# Patient Record
Sex: Male | Born: 1965 | Race: White | Hispanic: No | Marital: Married | Smoking: Never smoker
Health system: Southern US, Community
[De-identification: ages and names within clinical notes are randomized; demographics above are authoritative.]

## PROBLEM LIST (undated history)

## (undated) DIAGNOSIS — N200 Calculus of kidney: Secondary | ICD-10-CM

## (undated) DIAGNOSIS — K219 Gastro-esophageal reflux disease without esophagitis: Secondary | ICD-10-CM

## (undated) DIAGNOSIS — R51 Headache: Secondary | ICD-10-CM

## (undated) DIAGNOSIS — R21 Rash and other nonspecific skin eruption: Secondary | ICD-10-CM

---

## 1998-07-16 ENCOUNTER — Emergency Department (HOSPITAL_COMMUNITY): Admission: EM | Admit: 1998-07-16 | Discharge: 1998-07-16 | Payer: Self-pay | Admitting: Emergency Medicine

## 2012-01-15 ENCOUNTER — Encounter (HOSPITAL_COMMUNITY): Payer: Self-pay | Admitting: Pharmacy Technician

## 2012-01-16 ENCOUNTER — Encounter (HOSPITAL_COMMUNITY)
Admission: RE | Admit: 2012-01-16 | Discharge: 2012-01-16 | Disposition: A | Discharge status: Home / Self Care | Payer: Commercial Managed Care - PPO | Source: Ambulatory Visit | Attending: Orthopedic Surgery | Admitting: Orthopedic Surgery

## 2012-01-16 ENCOUNTER — Other Ambulatory Visit: Payer: Self-pay | Admitting: Orthopedic Surgery

## 2012-01-16 ENCOUNTER — Encounter (HOSPITAL_COMMUNITY): Payer: Self-pay

## 2012-01-16 HISTORY — DX: Gastro-esophageal reflux disease without esophagitis: K21.9

## 2012-01-16 HISTORY — DX: Headache: R51

## 2012-01-16 LAB — SURGICAL PCR SCREEN
MRSA, PCR: NEGATIVE
Staphylococcus aureus: POSITIVE — AB

## 2012-01-16 LAB — BASIC METABOLIC PANEL
CO2: 26 mEq/L (ref 19–32)
Calcium: 10.3 mg/dL (ref 8.4–10.5)
Creatinine, Ser: 1.21 mg/dL (ref 0.50–1.35)
GFR calc non Af Amer: 72 mL/min — ABNORMAL LOW (ref >90)
Sodium: 135 mEq/L (ref 135–145)

## 2012-01-16 LAB — CBC
MCH: 29.4 pg (ref 26.0–34.0)
MCV: 82.9 fL (ref 78.0–100.0)
Platelets: 225 10*3/uL (ref 150–400)
RBC: 4.8 MIL/uL (ref 4.22–5.81)
RDW: 12.6 % (ref 11.5–15.5)
WBC: 8.3 10*3/uL (ref 4.0–10.5)

## 2012-01-16 NOTE — Pre-Procedure Instructions (Signed)
22 [name]  01/16/2012   Your procedure is scheduled on:  01/17/12  Report to [facility] at 1150 AM.  Call this number if you have problems the morning of surgery: [phone]   Remember:   Do not eat food:After Midnight.  May have clear liquids:until Midnight .  Marland Kitchen  Take these medicines the morning of surgery with A SIP OF WATER: hydrocodone   Do not wear jewelry, make-up or nail polish.  Do not wear lotions, powders, or perfumes. You may wear deodorant.  Do not shave 48 hours prior to surgery. Men may shave face and neck.  Do not bring valuables to the hospital.  Contacts, dentures or bridgework may not be worn into surgery.  Leave suitcase in the car. After surgery it may be brought to your room.  For patients admitted to the hospital, checkout time is 11:00 AM the day of discharge.   Patients discharged the day of surgery will not be allowed to drive home.  Name and phone number of your driver: family  Special Instructions: CHG Shower Use Special Wash: 1/2 bottle night before surgery and 1/2 bottle morning of surgery.   Please read over the following fact sheets that you were given: Pain Booklet, Coughing and Deep Breathing, MRSA Information and Surgical Site Infection Prevention

## 2012-01-17 ENCOUNTER — Ambulatory Visit (HOSPITAL_COMMUNITY)
Admission: RE | Admit: 2012-01-17 | Discharge: 2012-01-17 | Disposition: A | Discharge status: Home / Self Care | Payer: Commercial Managed Care - PPO | Source: Ambulatory Visit | Attending: Orthopedic Surgery | Admitting: Orthopedic Surgery

## 2012-01-17 ENCOUNTER — Encounter (HOSPITAL_COMMUNITY): Payer: Self-pay | Admitting: Anesthesiology

## 2012-01-17 ENCOUNTER — Ambulatory Visit (HOSPITAL_COMMUNITY): Payer: Commercial Managed Care - PPO

## 2012-01-17 ENCOUNTER — Ambulatory Visit (HOSPITAL_COMMUNITY): Payer: Commercial Managed Care - PPO | Admitting: Anesthesiology

## 2012-01-17 ENCOUNTER — Encounter (HOSPITAL_COMMUNITY)
Admission: RE | Disposition: A | Discharge status: Home / Self Care | Payer: Self-pay | Source: Ambulatory Visit | Attending: Orthopedic Surgery

## 2012-01-17 ENCOUNTER — Encounter (HOSPITAL_COMMUNITY): Payer: Self-pay | Admitting: *Deleted

## 2012-01-17 DIAGNOSIS — M5126 Other intervertebral disc displacement, lumbar region: Secondary | ICD-10-CM | POA: Insufficient documentation

## 2012-01-17 DIAGNOSIS — M5410 Radiculopathy, site unspecified: Secondary | ICD-10-CM

## 2012-01-17 DIAGNOSIS — Z01812 Encounter for preprocedural laboratory examination: Secondary | ICD-10-CM | POA: Insufficient documentation

## 2012-01-17 DIAGNOSIS — K219 Gastro-esophageal reflux disease without esophagitis: Secondary | ICD-10-CM | POA: Insufficient documentation

## 2012-01-17 HISTORY — PX: LUMBAR LAMINECTOMY/DECOMPRESSION MICRODISCECTOMY: SHX5026

## 2012-01-17 HISTORY — DX: Rash and other nonspecific skin eruption: R21

## 2012-01-17 LAB — HEPATIC FUNCTION PANEL
ALT: 43 U/L (ref 0–53)
Albumin: 4.4 g/dL (ref 3.5–5.2)
Alkaline Phosphatase: 55 U/L (ref 39–117)
Indirect Bilirubin: 1.2 mg/dL — ABNORMAL HIGH (ref 0.3–0.9)
Total Protein: 7.7 g/dL (ref 6.0–8.3)

## 2012-01-17 LAB — CBC WITH DIFFERENTIAL/PLATELET
Eosinophils Relative: 1 % (ref 0–5)
HCT: 40.6 % (ref 39.0–52.0)
Hemoglobin: 14 g/dL (ref 13.0–17.0)
Lymphocytes Relative: 27 % (ref 12–46)
Lymphs Abs: 2.2 10*3/uL (ref 0.7–4.0)
MCV: 83 fL (ref 78.0–100.0)
Monocytes Absolute: 0.4 10*3/uL (ref 0.1–1.0)
Monocytes Relative: 6 % (ref 3–12)
Neutro Abs: 5.2 10*3/uL (ref 1.7–7.7)
WBC: 7.9 10*3/uL (ref 4.0–10.5)

## 2012-01-17 LAB — TYPE AND SCREEN: ABO/RH(D): A POS

## 2012-01-17 LAB — URINALYSIS, ROUTINE W REFLEX MICROSCOPIC
Glucose, UA: NEGATIVE mg/dL
Hgb urine dipstick: NEGATIVE
Leukocytes, UA: NEGATIVE
Protein, ur: NEGATIVE mg/dL
Specific Gravity, Urine: 1.022 (ref 1.005–1.030)
Urobilinogen, UA: 0.2 mg/dL (ref 0.0–1.0)

## 2012-01-17 LAB — APTT: aPTT: 30 seconds (ref 24–37)

## 2012-01-17 SURGERY — LUMBAR LAMINECTOMY/DECOMPRESSION MICRODISCECTOMY
Anesthesia: General | Site: Back | Laterality: Right | Wound class: Clean

## 2012-01-17 MED ORDER — METHYLPREDNISOLONE ACETATE 80 MG/ML IJ SUSP
INTRAMUSCULAR | Status: DC | PRN
  Administered 2012-01-17: 40 mg

## 2012-01-17 MED ORDER — THROMBIN 20000 UNITS EX KIT
PACK | CUTANEOUS | Status: DC | PRN
  Administered 2012-01-17: 12:00:00 via TOPICAL

## 2012-01-17 MED ORDER — NEOSTIGMINE METHYLSULFATE 1 MG/ML IJ SOLN
INTRAMUSCULAR | Status: DC | PRN
  Administered 2012-01-17: 5 mg via INTRAVENOUS

## 2012-01-17 MED ORDER — HEMOSTATIC AGENTS (NO CHARGE) OPTIME
TOPICAL | Status: DC | PRN
  Administered 2012-01-17: 1 via TOPICAL

## 2012-01-17 MED ORDER — HYDROMORPHONE HCL PF 1 MG/ML IJ SOLN
0.2500 mg | INTRAMUSCULAR | Status: DC | PRN
  Administered 2012-01-17 (×3): 0.5 mg via INTRAVENOUS

## 2012-01-17 MED ORDER — METOCLOPRAMIDE HCL 5 MG/ML IJ SOLN: 10.0000 mg | Freq: Once | INTRAMUSCULAR | Status: DC | PRN

## 2012-01-17 MED ORDER — INDIGOTINDISULFONATE SODIUM 8 MG/ML IJ SOLN
INTRAMUSCULAR | Status: DC | PRN
  Administered 2012-01-17: 1 mL

## 2012-01-17 MED ORDER — BUPIVACAINE-EPINEPHRINE 0.25% -1:200000 IJ SOLN
INTRAMUSCULAR | Status: DC | PRN
  Administered 2012-01-17: 5 mL

## 2012-01-17 MED ORDER — GLYCOPYRROLATE 0.2 MG/ML IJ SOLN
INTRAMUSCULAR | Status: DC | PRN
  Administered 2012-01-17: .8 mg via INTRAVENOUS

## 2012-01-17 MED ORDER — FENTANYL CITRATE 0.05 MG/ML IJ SOLN
INTRAMUSCULAR | Status: DC | PRN
  Administered 2012-01-17: 50 ug via INTRAVENOUS
  Administered 2012-01-17: 100 ug via INTRAVENOUS

## 2012-01-17 MED ORDER — DEXAMETHASONE SODIUM PHOSPHATE 10 MG/ML IJ SOLN
INTRAMUSCULAR | Status: DC | PRN
  Administered 2012-01-17: 10 mg via INTRAVENOUS

## 2012-01-17 MED ORDER — ACETAMINOPHEN 10 MG/ML IV SOLN
INTRAVENOUS | Status: AC
  Filled 2012-01-17: qty 100

## 2012-01-17 MED ORDER — LACTATED RINGERS IV SOLN
INTRAVENOUS | Status: DC
  Administered 2012-01-17: 11:00:00 via INTRAVENOUS

## 2012-01-17 MED ORDER — HYDROMORPHONE HCL PF 1 MG/ML IJ SOLN
INTRAMUSCULAR | Status: AC
  Administered 2012-01-17: 0.5 mg via INTRAVENOUS
  Filled 2012-01-17: qty 1

## 2012-01-17 MED ORDER — POVIDONE-IODINE 7.5 % EX SOLN
Freq: Once | CUTANEOUS | Status: DC
  Filled 2012-01-17: qty 118

## 2012-01-17 MED ORDER — DROPERIDOL 2.5 MG/ML IJ SOLN
INTRAMUSCULAR | Status: DC | PRN
  Administered 2012-01-17: .625 mg via INTRAVENOUS

## 2012-01-17 MED ORDER — ONDANSETRON HCL 4 MG/2ML IJ SOLN
INTRAMUSCULAR | Status: DC | PRN
  Administered 2012-01-17: 4 mg via INTRAVENOUS

## 2012-01-17 MED ORDER — PROPOFOL 10 MG/ML IV EMUL
INTRAVENOUS | Status: DC | PRN
  Administered 2012-01-17: 200 mg via INTRAVENOUS

## 2012-01-17 MED ORDER — OXYCODONE HCL 5 MG PO TABS: 5.0000 mg | ORAL_TABLET | Freq: Once | ORAL | Status: DC | PRN

## 2012-01-17 MED ORDER — ROCURONIUM BROMIDE 100 MG/10ML IV SOLN
INTRAVENOUS | Status: DC | PRN
  Administered 2012-01-17: 15 mg via INTRAVENOUS
  Administered 2012-01-17: 50 mg via INTRAVENOUS

## 2012-01-17 MED ORDER — HYDROMORPHONE HCL PF 1 MG/ML IJ SOLN
INTRAMUSCULAR | Status: AC
  Filled 2012-01-17: qty 1

## 2012-01-17 MED ORDER — LIDOCAINE HCL (CARDIAC) 20 MG/ML IV SOLN
INTRAVENOUS | Status: DC | PRN
  Administered 2012-01-17: 100 mg via INTRAVENOUS

## 2012-01-17 MED ORDER — ACETAMINOPHEN 10 MG/ML IV SOLN
INTRAVENOUS | Status: DC | PRN
  Administered 2012-01-17: 1000 mg via INTRAVENOUS

## 2012-01-17 MED ORDER — MIDAZOLAM HCL 5 MG/5ML IJ SOLN
INTRAMUSCULAR | Status: DC | PRN
  Administered 2012-01-17: 2 mg via INTRAVENOUS

## 2012-01-17 MED ORDER — CLINDAMYCIN PHOSPHATE 900 MG/50ML IV SOLN
900.0000 mg | INTRAVENOUS | Status: AC
  Administered 2012-01-17: 900 mg via INTRAVENOUS
  Filled 2012-01-17: qty 50

## 2012-01-17 MED ORDER — LACTATED RINGERS IV SOLN
INTRAVENOUS | Status: DC | PRN
  Administered 2012-01-17 (×2): via INTRAVENOUS

## 2012-01-17 SURGICAL SUPPLY — 62 items
APL SKNCLS STERI-STRIP NONHPOA (GAUZE/BANDAGES/DRESSINGS)
BENZOIN TINCTURE PRP APPL 2/3 (GAUZE/BANDAGES/DRESSINGS) IMPLANT
BUR ROUND PRECISION 4.0 (BURR) ×2 IMPLANT
CANISTER SUCTION 2500CC (MISCELLANEOUS) ×2 IMPLANT
CLOTH BEACON ORANGE TIMEOUT ST (SAFETY) ×2 IMPLANT
CLSR STERI-STRIP ANTIMIC 1/2X4 (GAUZE/BANDAGES/DRESSINGS) ×1 IMPLANT
CONT SPEC STER OR (MISCELLANEOUS) ×2 IMPLANT
CORDS BIPOLAR (ELECTRODE) ×2 IMPLANT
COVER SURGICAL LIGHT HANDLE (MISCELLANEOUS) ×2 IMPLANT
DRAIN CHANNEL 10F 3/8 F FF (DRAIN) IMPLANT
DRAPE POUCH INSTRU U-SHP 10X18 (DRAPES) ×4 IMPLANT
DRAPE SURG 17X23 STRL (DRAPES) ×8 IMPLANT
DURAPREP 26ML APPLICATOR (WOUND CARE) ×2 IMPLANT
ELECT BLADE 4.0 EZ CLEAN MEGAD (MISCELLANEOUS)
ELECT BLADE 6.5 EXT (BLADE) IMPLANT
ELECT CAUTERY BLADE 6.4 (BLADE) ×2 IMPLANT
ELECT REM PT RETURN 9FT ADLT (ELECTROSURGICAL) ×2
ELECTRODE BLDE 4.0 EZ CLN MEGD (MISCELLANEOUS) IMPLANT
ELECTRODE REM PT RTRN 9FT ADLT (ELECTROSURGICAL) ×1 IMPLANT
EVACUATOR SILICONE 100CC (DRAIN) IMPLANT
FILTER STRAW FLUID ASPIR (MISCELLANEOUS) ×2 IMPLANT
GAUZE SPONGE 4X4 16PLY XRAY LF (GAUZE/BANDAGES/DRESSINGS) ×4 IMPLANT
GLOVE BIO SURGEON STRL SZ8 (GLOVE) ×2 IMPLANT
GLOVE BIOGEL PI IND STRL 8 (GLOVE) ×1 IMPLANT
GLOVE BIOGEL PI INDICATOR 8 (GLOVE) ×1
GOWN STRL NON-REIN LRG LVL3 (GOWN DISPOSABLE) ×4 IMPLANT
IV CATH 14GX2 1/4 (CATHETERS) ×2 IMPLANT
KIT BASIN OR (CUSTOM PROCEDURE TRAY) ×2 IMPLANT
KIT ROOM TURNOVER OR (KITS) ×2 IMPLANT
NDL 18GX1X1/2 (RX/OR ONLY) (NEEDLE) ×1 IMPLANT
NDL HYPO 25GX1X1/2 BEV (NEEDLE) ×1 IMPLANT
NDL SPNL 18GX3.5 QUINCKE PK (NEEDLE) ×2 IMPLANT
NEEDLE 18GX1X1/2 (RX/OR ONLY) (NEEDLE) ×2 IMPLANT
NEEDLE HYPO 25GX1X1/2 BEV (NEEDLE) ×2 IMPLANT
NEEDLE SPNL 18GX3.5 QUINCKE PK (NEEDLE) ×4 IMPLANT
NS IRRIG 1000ML POUR BTL (IV SOLUTION) ×2 IMPLANT
PACK LAMINECTOMY ORTHO (CUSTOM PROCEDURE TRAY) ×2 IMPLANT
PACK UNIVERSAL I (CUSTOM PROCEDURE TRAY) ×2 IMPLANT
PAD ARMBOARD 7.5X6 YLW CONV (MISCELLANEOUS) ×4 IMPLANT
PATTIES SURGICAL .5 X.5 (GAUZE/BANDAGES/DRESSINGS) IMPLANT
PATTIES SURGICAL .5 X1 (DISPOSABLE) ×2 IMPLANT
PATTIES SURGICAL 1X1 (DISPOSABLE) IMPLANT
SPONGE GAUZE 4X4 12PLY (GAUZE/BANDAGES/DRESSINGS) ×2 IMPLANT
STRIP CLOSURE SKIN 1/2X4 (GAUZE/BANDAGES/DRESSINGS) IMPLANT
SURGIFLO TRUKIT (HEMOSTASIS) IMPLANT
SURGIFLO W/THROMBIN 8M KIT (HEMOSTASIS) ×1 IMPLANT
SUT ETHILON 3 0 FSL (SUTURE) IMPLANT
SUT VIC AB 0 CT1 27 (SUTURE)
SUT VIC AB 0 CT1 27XBRD ANBCTR (SUTURE) IMPLANT
SUT VIC AB 0 CT2 27 (SUTURE) ×2 IMPLANT
SUT VIC AB 1 CT1 18XCR BRD 8 (SUTURE) ×1 IMPLANT
SUT VIC AB 1 CT1 8-18 (SUTURE) ×2
SUT VIC AB 2-0 CT2 18 VCP726D (SUTURE) ×2 IMPLANT
SYR 20CC LL (SYRINGE) IMPLANT
SYR BULB IRRIGATION 50ML (SYRINGE) ×2 IMPLANT
SYR CONTROL 10ML LL (SYRINGE) ×2 IMPLANT
SYR TB 1ML 26GX3/8 SAFETY (SYRINGE) ×4 IMPLANT
SYR TB 1ML LUER SLIP (SYRINGE) ×4 IMPLANT
TOWEL OR 17X24 6PK STRL BLUE (TOWEL DISPOSABLE) ×2 IMPLANT
TOWEL OR 17X26 10 PK STRL BLUE (TOWEL DISPOSABLE) ×2 IMPLANT
WATER STERILE IRR 1000ML POUR (IV SOLUTION) ×2 IMPLANT
YANKAUER SUCT BULB TIP NO VENT (SUCTIONS) ×2 IMPLANT

## 2012-01-17 NOTE — Transfer of Care (Signed)
Immediate Anesthesia Transfer of Care Note  Patient: [name]  Procedure(s) Performed: Procedure(s) (LRB): LUMBAR LAMINECTOMY/DECOMPRESSION MICRODISCECTOMY (Right)  Patient Location: PACU  Anesthesia Type: General  Level of Consciousness: awake, alert  and oriented  Airway & Oxygen Therapy: Patient Spontanous Breathing  Post-op Assessment: Report given to PACU RN and Post -op Vital signs reviewed and stable  Post vital signs: Reviewed and stable  Complications: No apparent anesthesia complications

## 2012-01-17 NOTE — Anesthesia Preprocedure Evaluation (Addendum)
Anesthesia Evaluation  Patient identified by MRN, date of birth, ID band Patient awake    Reviewed: Allergy & Precautions, H&P , NPO status , Patient's Chart, lab work & pertinent test results, reviewed documented beta blocker date and time   History of Anesthesia Complications Negative for: history of anesthetic complications  Airway Mallampati: II TM Distance: >3 FB Neck ROM: full    Dental  (+) Dental Advisory Given and Teeth Intact   Pulmonary neg pulmonary ROS,          Cardiovascular negative cardio ROS      Neuro/Psych  Headaches, negative psych ROS   GI/Hepatic Neg liver ROS, GERD-  Medicated and Controlled,  Endo/Other  negative endocrine ROS  Renal/GU negative Renal ROS  negative genitourinary   Musculoskeletal   Abdominal   Peds  Hematology negative hematology ROS (+)   Anesthesia Other Findings See surgeon's H&P   Reproductive/Obstetrics negative OB ROS                         Anesthesia Physical Anesthesia Plan  ASA: II  Anesthesia Plan: General   Post-op Pain Management:    Induction: Intravenous  Airway Management Planned: Oral ETT  Additional Equipment:   Intra-op Plan:   Post-operative Plan: Extubation in OR  Informed Consent: I have reviewed the patients History and Physical, chart, labs and discussed the procedure including the risks, benefits and alternatives for the proposed anesthesia with the patient or authorized representative who has indicated his/her understanding and acceptance.   Dental Advisory Given  Plan Discussed with: CRNA and Surgeon  Anesthesia Plan Comments:         Anesthesia Quick Evaluation

## 2012-01-17 NOTE — Preoperative (Signed)
Beta Blockers   Reason not to administer Beta Blockers:Not Applicable 

## 2012-01-17 NOTE — H&P (Signed)
PREOPERATIVE H&P  Chief Complaint: Right leg pain  HPI: [name] is a 46 y.o. male who presents with right leg pain x 6 months  Past Medical History  Diagnosis Date  . Headache   . GERD (gastroesophageal reflux disease)   . Rash    Past Surgical History  Procedure Date  . No past surgeries    History   Social History  . Marital Status: Married    Spouse Name: N/A    Number of Children: N/A  . Years of Education: N/A   Social History Main Topics  . Smoking status: Never Smoker   . Smokeless tobacco: Not on file  . Alcohol Use: Yes     weekly  . Drug Use: No  . Sexually Active:    Other Topics Concern  . Not on file   Social History Narrative  . No narrative on file   No family history on file. No Known Allergies Prior to Admission medications   Medication Sig Start Date End Date Taking? Authorizing Provider  acetaminophen (TYLENOL) 500 MG tablet Take 500 mg by mouth every 6 (six) hours as needed. For pain   Yes Historical Provider, MD  clotrimazole (LOTRIMIN) 1 % cream Apply 1 application topically 2 (two) times daily.   Yes Historical Provider, MD  HYDROcodone-acetaminophen (NORCO) 5-325 MG per tablet Take 1-2 tablets by mouth every 8 (eight) hours as needed. For pain   Yes Historical Provider, MD     All other systems have been reviewed and were otherwise negative with the exception of those mentioned in the HPI and as above.  Physical Exam: Filed Vitals:   01/17/12 0939  BP: 145/93  Pulse: 80  Temp: 98.3 F (36.8 C)  Resp: 20    General: Alert, no acute distress Cardiovascular: No pedal edema Respiratory: No cyanosis, no use of accessory musculature GI: No organomegaly, abdomen is soft and non-tender Skin: No lesions in the area of chief complaint Neurologic: Sensation intact distally Psychiatric: Patient is competent for consent with normal mood and affect Lymphatic: No axillary or cervical lymphadenopathy  MUSCULOSKELETAL: + SLR on  right  Assessment/Plan: rigth S1 radiculopathy Plan for Procedure(s): Right L5/S1 microdiscectomy   Emilee Hero, MD 01/17/2012 10:59 AM

## 2012-01-17 NOTE — Anesthesia Postprocedure Evaluation (Signed)
Anesthesia Post Note  Patient: [name]  Procedure(s) Performed: Procedure(s) (LRB): LUMBAR LAMINECTOMY/DECOMPRESSION MICRODISCECTOMY (Right)  Anesthesia type: general  Patient location: PACU  Post pain: Pain level controlled  Post assessment: Patient's Cardiovascular Status Stable  Last Vitals:  Filed Vitals:   01/17/12 1345  BP:   Pulse: 70  Temp:   Resp: 15    Post vital signs: Reviewed and stable  Level of consciousness: sedated  Complications: No apparent anesthesia complications

## 2012-01-18 NOTE — Op Note (Signed)
[name], [name] NO.:  [id number]  MEDICAL RECORD NO.:  [id number]  LOCATION:  [facility]                         FACILITY:  [facility]  PHYSICIAN:  Estill Bamberg, MD      DATE OF BIRTH:  1966-01-11  DATE OF PROCEDURE:  01/17/2012 DATE OF DISCHARGE:  01/17/2012                              OPERATIVE REPORT   PREOPERATIVE DIAGNOSES: 1. Right-sided S1 radiculopathy. 2. Right-sided L5-S1 disk herniation.  POSTOPERATIVE DIAGNOSES: 1. Right-sided S1 radiculopathy. 2. Right-sided L5-S1 disk herniation.  SURGEON:  Estill Bamberg, MD  ASSISTANT:  None.  ANESTHESIA:  General endotracheal anesthesia.  COMPLICATIONS:  None.  DISPOSITION:  Stable.  ESTIMATED BLOOD LOSS:  Minimal.  INDICATIONS FOR PROCEDURE:  Briefly, [name] is a very pleasant 46 year old male who was referred to me on January 12, 2012, with a 10-month history of severe pain in the right leg.  The patient was evaluated by me and clearly noted to have severe right leg pain in the distribution of the S1 nerve.  He did fail conservative care in the form of physical therapy, medications, and two epidural injections.  I did review an MRI, which was clearly notable for a large L5-S1 disk herniation, clearly causing compression of the lateral recesses involving both the right and the left sides.  However, the patient's symptoms were clearly associated with right-sided S1 radiculopathy and he did not have any symptoms related to the left side at the time of my evaluation with the patient in the office.  Given the patient's debilitating pain and his failure of nonoperative measures, we did have a discussion regarding going forward with the right-sided L5-S1 microdiskectomy procedure.  The patient fully understood the risks and limitations of the procedure as outlined in my preoperative note.  OPERATIVE DETAILS:  On January 17, 2012, the patient was brought to Surgery and general endotracheal anesthesia was  administered.  The patient was placed prone on a well-padded flat Jackson bed with a Wilson frame.  All bony prominences were meticulously padded.  Antibiotics were given. SCDs were placed and a time-out procedure was performed.  I then placed two 18-gauge spinal needles over the midline of the lumbar spine.  A lateral intraoperative radiograph did confirm the position of the needles and did help me optimize my midline incision.  I then made an incision over the midline, centered over the L5-S1 interspace.  The fascia was identified and incised in a curvilinear fashion towards the right side.  The paraspinal musculature was bluntly swept laterally and the lamina of L5 and S1 were readily noted.  A self-retaining McCullough retractor was placed.  I then obtained a second intraoperative lateral radiograph to confirm the appropriate operative level.  I then used a high-speed bur to remove the inferior medial aspect of the L5 lamina. Ligamentum flavum was identified and taken down using a series of curettes and Kerrison punches.  The dural sac and the traversing S1 nerve roots were readily identified and noted to be under significant pressure.  The nerve was gently swept medially.  I had an assistant to hold the medial retraction of the traversing S1 nerve.  It was readily  noted that the traversing S1 nerve was under significant tension.  I did use a Penfield 4 to place downward pressure over the intervertebral space and a large disk fragment did clear itself through an annular rent.  This was removed.  I then used a Public house manager to continue to explore the epidural space anterior to the S1 nerve, and it was readily noted that there was ongoing pressure.  It was readily noted that there continued to be a prominence at the medial aspect of the nerve.  I did continue to provide medial mobilization of the nerve and I did use reverse angled Epstein curette to displace the prominent disk  fragments into the intervertebral space.  These were removed using a forward angled pituitary.  At the termination of this portion of the procedure, I was easily able to mobilize each traversing S1 nerve both medially and laterally with no undue pressure noted.  I then used SurgiFlo to control all epidural bleeding.  Once the SurgiFlo was removed, there was no undue bleeding encountered.  At this point, the wound was copiously irrigated.  I then infiltrated 40 mg of Depo-Medrol about the S1 nerve on the right side.  I then closed the fascia using #1 Vicryl.  The subcutaneous layer was then closed using 2-0 Vicryl and the skin was then closed using 3-0 Monocryl.  All instrument counts were correct at the termination of the procedure.     Estill Bamberg, MD     MD/MEDQ  D:  01/17/2012  T:  01/18/2012  Job:  [id number]

## 2012-01-19 ENCOUNTER — Encounter (HOSPITAL_COMMUNITY): Payer: Self-pay | Admitting: Orthopedic Surgery

## 2012-12-17 ENCOUNTER — Ambulatory Visit (HOSPITAL_COMMUNITY): Payer: Commercial Managed Care - PPO | Admitting: Certified Registered"

## 2012-12-17 ENCOUNTER — Ambulatory Visit (HOSPITAL_COMMUNITY)
Admission: EM | Admit: 2012-12-17 | Discharge: 2012-12-17 | Disposition: A | Discharge status: Home / Self Care | Payer: Commercial Managed Care - PPO | Attending: Urology | Admitting: Urology

## 2012-12-17 ENCOUNTER — Emergency Department (HOSPITAL_COMMUNITY): Payer: Commercial Managed Care - PPO

## 2012-12-17 ENCOUNTER — Encounter (HOSPITAL_COMMUNITY): Admission: EM | Disposition: A | Discharge status: Home / Self Care | Payer: Self-pay | Source: Home / Self Care

## 2012-12-17 ENCOUNTER — Encounter (HOSPITAL_COMMUNITY): Payer: Self-pay

## 2012-12-17 ENCOUNTER — Encounter (HOSPITAL_COMMUNITY): Payer: Self-pay | Admitting: Certified Registered"

## 2012-12-17 ENCOUNTER — Other Ambulatory Visit: Payer: Self-pay | Admitting: Urology

## 2012-12-17 DIAGNOSIS — K219 Gastro-esophageal reflux disease without esophagitis: Secondary | ICD-10-CM | POA: Insufficient documentation

## 2012-12-17 DIAGNOSIS — N1330 Unspecified hydronephrosis: Secondary | ICD-10-CM | POA: Insufficient documentation

## 2012-12-17 DIAGNOSIS — N201 Calculus of ureter: Secondary | ICD-10-CM | POA: Insufficient documentation

## 2012-12-17 HISTORY — PX: CYSTOSCOPY WITH RETROGRADE PYELOGRAM, URETEROSCOPY AND STENT PLACEMENT: SHX5789

## 2012-12-17 LAB — CBC WITH DIFFERENTIAL/PLATELET
Basophils Absolute: 0.1 10*3/uL (ref 0.0–0.1)
Lymphocytes Relative: 14 % (ref 12–46)
Lymphs Abs: 1.5 10*3/uL (ref 0.7–4.0)
Neutro Abs: 8.7 10*3/uL — ABNORMAL HIGH (ref 1.7–7.7)
Platelets: 222 10*3/uL (ref 150–400)
RBC: 4.97 MIL/uL (ref 4.22–5.81)
RDW: 12.8 % (ref 11.5–15.5)
WBC: 10.7 10*3/uL — ABNORMAL HIGH (ref 4.0–10.5)

## 2012-12-17 LAB — BASIC METABOLIC PANEL
CO2: 17 mEq/L — ABNORMAL LOW (ref 19–32)
Chloride: 100 mEq/L (ref 96–112)
Glucose, Bld: 144 mg/dL — ABNORMAL HIGH (ref 70–99)
Potassium: 3.2 mEq/L — ABNORMAL LOW (ref 3.5–5.1)
Sodium: 136 mEq/L (ref 135–145)

## 2012-12-17 LAB — URINALYSIS, ROUTINE W REFLEX MICROSCOPIC
Glucose, UA: 100 mg/dL — AB
Leukocytes, UA: NEGATIVE
Protein, ur: NEGATIVE mg/dL
Specific Gravity, Urine: 1.022 (ref 1.005–1.030)
pH: 7 (ref 5.0–8.0)

## 2012-12-17 LAB — SURGICAL PCR SCREEN
MRSA, PCR: NEGATIVE
Staphylococcus aureus: NEGATIVE

## 2012-12-17 SURGERY — CYSTOURETEROSCOPY, WITH RETROGRADE PYELOGRAM AND STENT INSERTION
Anesthesia: General | Site: Ureter | Laterality: Left | Wound class: Clean Contaminated

## 2012-12-17 MED ORDER — LIDOCAINE HCL (PF) 2 % IJ SOLN
INTRAMUSCULAR | Status: DC | PRN
  Administered 2012-12-17: 30 mg

## 2012-12-17 MED ORDER — KETOROLAC TROMETHAMINE 30 MG/ML IJ SOLN
INTRAMUSCULAR | Status: DC | PRN
  Administered 2012-12-17: 30 mg via INTRAVENOUS

## 2012-12-17 MED ORDER — FENTANYL CITRATE 0.05 MG/ML IJ SOLN
50.0000 ug | INTRAMUSCULAR | Status: DC | PRN
  Administered 2012-12-17: 50 ug via INTRAVENOUS
  Administered 2012-12-17: 100 ug via INTRAVENOUS
  Administered 2012-12-17: 50 ug via INTRAVENOUS

## 2012-12-17 MED ORDER — BELLADONNA ALKALOIDS-OPIUM 16.2-60 MG RE SUPP
RECTAL | Status: AC
  Filled 2012-12-17: qty 1

## 2012-12-17 MED ORDER — PROPOFOL 10 MG/ML IV BOLUS
INTRAVENOUS | Status: DC | PRN
  Administered 2012-12-17: 200 mg via INTRAVENOUS

## 2012-12-17 MED ORDER — ONDANSETRON HCL 4 MG/2ML IJ SOLN
4.0000 mg | Freq: Once | INTRAMUSCULAR | Status: AC
  Administered 2012-12-17: 4 mg via INTRAVENOUS

## 2012-12-17 MED ORDER — MUPIROCIN 2 % EX OINT
TOPICAL_OINTMENT | Freq: Two times a day (BID) | CUTANEOUS | Status: DC
  Administered 2012-12-17: 13:00:00 via NASAL
  Filled 2012-12-17: qty 22

## 2012-12-17 MED ORDER — TAMSULOSIN HCL 0.4 MG PO CAPS: 0.4000 mg | ORAL_CAPSULE | Freq: Every day | ORAL | Status: DC

## 2012-12-17 MED ORDER — DEXTROSE-NACL 5-0.45 % IV SOLN: INTRAVENOUS | Status: DC

## 2012-12-17 MED ORDER — LIDOCAINE HCL 2 % EX GEL
CUTANEOUS | Status: AC
  Filled 2012-12-17: qty 10

## 2012-12-17 MED ORDER — ONDANSETRON HCL 4 MG/2ML IJ SOLN
INTRAMUSCULAR | Status: AC
  Filled 2012-12-17: qty 2

## 2012-12-17 MED ORDER — SUCCINYLCHOLINE CHLORIDE 20 MG/ML IJ SOLN
INTRAMUSCULAR | Status: DC | PRN
  Administered 2012-12-17: 100 mg via INTRAVENOUS

## 2012-12-17 MED ORDER — HYDROMORPHONE HCL PF 1 MG/ML IJ SOLN
1.0000 mg | Freq: Once | INTRAMUSCULAR | Status: AC
  Administered 2012-12-17: 1 mg via INTRAVENOUS
  Filled 2012-12-17: qty 1

## 2012-12-17 MED ORDER — FENTANYL CITRATE 0.05 MG/ML IJ SOLN
INTRAMUSCULAR | Status: DC | PRN
Start: 2012-12-17 — End: 2012-12-17
  Administered 2012-12-17 (×2): 50 ug via INTRAVENOUS

## 2012-12-17 MED ORDER — HYDROCODONE-IBUPROFEN 5-200 MG PO TABS: 1.0000 | ORAL_TABLET | Freq: Four times a day (QID) | ORAL | Status: DC | PRN

## 2012-12-17 MED ORDER — FENTANYL CITRATE 0.05 MG/ML IJ SOLN
INTRAMUSCULAR | Status: AC
  Filled 2012-12-17: qty 2

## 2012-12-17 MED ORDER — MORPHINE SULFATE 4 MG/ML IJ SOLN
4.0000 mg | Freq: Once | INTRAMUSCULAR | Status: AC
  Administered 2012-12-17: 4 mg via INTRAVENOUS
  Filled 2012-12-17: qty 1

## 2012-12-17 MED ORDER — 0.9 % SODIUM CHLORIDE (POUR BTL) OPTIME
TOPICAL | Status: DC | PRN
  Administered 2012-12-17: 1000 mL

## 2012-12-17 MED ORDER — BELLADONNA ALKALOIDS-OPIUM 16.2-60 MG RE SUPP
RECTAL | Status: DC | PRN
  Administered 2012-12-17: 1 via RECTAL

## 2012-12-17 MED ORDER — IOHEXOL 300 MG/ML  SOLN
INTRAMUSCULAR | Status: AC
  Filled 2012-12-17: qty 1

## 2012-12-17 MED ORDER — PROMETHAZINE HCL 25 MG/ML IJ SOLN: 6.2500 mg | INTRAMUSCULAR | Status: DC | PRN

## 2012-12-17 MED ORDER — CEFAZOLIN SODIUM-DEXTROSE 2-3 GM-% IV SOLR
INTRAVENOUS | Status: AC
  Filled 2012-12-17: qty 50

## 2012-12-17 MED ORDER — PROMETHAZINE HCL 25 MG/ML IJ SOLN
25.0000 mg | Freq: Four times a day (QID) | INTRAMUSCULAR | Status: DC | PRN
  Administered 2012-12-17 (×2): 6.25 mg via INTRAVENOUS
  Filled 2012-12-17: qty 1

## 2012-12-17 MED ORDER — MIDAZOLAM HCL 5 MG/5ML IJ SOLN
INTRAMUSCULAR | Status: DC | PRN
  Administered 2012-12-17: 2 mg via INTRAVENOUS

## 2012-12-17 MED ORDER — SODIUM CHLORIDE 0.9 % IV SOLN: 1.0000 mg/h | INTRAVENOUS | Status: DC

## 2012-12-17 MED ORDER — SODIUM CHLORIDE 0.9 % IV BOLUS (SEPSIS)
1000.0000 mL | Freq: Once | INTRAVENOUS | Status: AC
  Administered 2012-12-17: 1000 mL via INTRAVENOUS

## 2012-12-17 MED ORDER — ONDANSETRON HCL 4 MG/2ML IJ SOLN
4.0000 mg | Freq: Once | INTRAMUSCULAR | Status: AC
  Administered 2012-12-17: 4 mg via INTRAVENOUS
  Filled 2012-12-17: qty 2

## 2012-12-17 MED ORDER — CEFAZOLIN SODIUM-DEXTROSE 2-3 GM-% IV SOLR
2.0000 g | INTRAVENOUS | Status: AC
  Administered 2012-12-17: 2 g via INTRAVENOUS

## 2012-12-17 MED ORDER — HYDROMORPHONE HCL PF 1 MG/ML IJ SOLN
0.5000 mg | INTRAMUSCULAR | Status: DC | PRN
  Administered 2012-12-17 (×4): 0.5 mg via INTRAVENOUS
  Filled 2012-12-17 (×2): qty 1

## 2012-12-17 MED ORDER — TRIMETHOPRIM 100 MG PO TABS: 100.0000 mg | ORAL_TABLET | ORAL | Status: DC

## 2012-12-17 MED ORDER — LACTATED RINGERS IV SOLN
INTRAVENOUS | Status: DC
  Administered 2012-12-17: 1000 mL via INTRAVENOUS

## 2012-12-17 MED ORDER — ONDANSETRON HCL 4 MG/2ML IJ SOLN
INTRAMUSCULAR | Status: DC | PRN
  Administered 2012-12-17: 4 mg via INTRAVENOUS

## 2012-12-17 MED ORDER — FENTANYL CITRATE 0.05 MG/ML IJ SOLN: 25.0000 ug | INTRAMUSCULAR | Status: DC | PRN

## 2012-12-17 MED ORDER — IOHEXOL 300 MG/ML  SOLN
INTRAMUSCULAR | Status: DC | PRN
  Administered 2012-12-17: 9 mL via URETHRAL

## 2012-12-17 MED ORDER — PHENAZOPYRIDINE HCL 200 MG PO TABS: 200.0000 mg | ORAL_TABLET | Freq: Three times a day (TID) | ORAL | Status: DC | PRN

## 2012-12-17 MED ORDER — ONDANSETRON HCL 4 MG/2ML IJ SOLN
4.0000 mg | INTRAMUSCULAR | Status: DC | PRN
  Administered 2012-12-17: 4 mg via INTRAVENOUS

## 2012-12-17 SURGICAL SUPPLY — 14 items
ADAPTER CATH URET PLST 4-6FR (CATHETERS) ×2 IMPLANT
ADPR CATH URET STRL DISP 4-6FR (CATHETERS) ×1
BAG URO CATCHER STRL LF (DRAPE) ×2 IMPLANT
CATH INTERMIT  6FR 70CM (CATHETERS) ×2 IMPLANT
CLOTH BEACON ORANGE TIMEOUT ST (SAFETY) ×2 IMPLANT
DRAPE CAMERA CLOSED 9X96 (DRAPES) ×2 IMPLANT
GLOVE BIOGEL M STRL SZ7.5 (GLOVE) ×2 IMPLANT
GOWN STRL REIN XL XLG (GOWN DISPOSABLE) ×2 IMPLANT
GUIDEWIRE STR DUAL SENSOR (WIRE) ×2 IMPLANT
MANIFOLD NEPTUNE II (INSTRUMENTS) ×2 IMPLANT
NS IRRIG 1000ML POUR BTL (IV SOLUTION) ×2 IMPLANT
PACK CYSTO (CUSTOM PROCEDURE TRAY) ×2 IMPLANT
SCRUB PCMX 4 OZ (MISCELLANEOUS) ×2 IMPLANT
TUBING CONNECTING 10 (TUBING) ×2 IMPLANT

## 2012-12-17 NOTE — H&P (Signed)
Urology Admission H&P  Chief Complaint: back/left flank pain.  History of Present Illness: 47 YO male seen today for complaint of left back pain X 2-3 weeks that progressively worsened this am to acute pain 9-10/10 with nausea/vomiting. Has chronic back pain and has hx of back surgery in 2013 but states this pain was much worse and did not improve with movement. Had 1 episode of "tea colored" urine but no gross hematuria. No previous hx of nephrolithiasis.  Past Medical History  Diagnosis Date  . Headache(784.0)   . GERD (gastroesophageal reflux disease)   . Rash    Past Surgical History  Procedure Laterality Date  . Lumbar laminectomy/decompression microdiscectomy  01/17/2012    Procedure: LUMBAR LAMINECTOMY/DECOMPRESSION MICRODISCECTOMY;  Surgeon: Emilee Hero, MD;  Location: [facility] OR;  Service: Orthopedics;  Laterality: Right;  Right sided lumbar 5-sacrum 1 microdisectomy    Home Medications:  Prescriptions prior to admission  Medication Sig Dispense Refill  . ibuprofen (ADVIL,MOTRIN) 200 MG tablet Take 600 mg by mouth every 6 (six) hours as needed for pain.       Allergies: No Known Allergies  History reviewed. No pertinent family history. Social History:  reports that he has never smoked. He does not have any smokeless tobacco history on file. He reports that  drinks alcohol. He reports that he does not use illicit drugs.  Review of Systems  Constitutional: Positive for chills.  HENT: Negative.   Eyes: Negative.   Respiratory: Negative.   Cardiovascular: Negative.   Gastrointestinal: Positive for nausea and vomiting.  Genitourinary: Positive for flank pain.  Musculoskeletal: Negative.   Skin: Negative.   Neurological: Negative.   Endo/Heme/Allergies: Negative.   Psychiatric/Behavioral: Negative.     Physical Exam:  Vital signs in last 24 hours: Temp:  [97 F (36.1 C)-97.8 F (36.6 C)] 97.8 F (36.6 C) (06/03 1310) Pulse Rate:  [60-74] 74 (06/03 1310) Resp:   [20-24] 20 (06/03 1310) BP: (123-161)/(76-101) 161/101 mmHg (06/03 1310) SpO2:  [97 %-100 %] 100 % (06/03 1310) Weight:  [99.791 kg (220 lb)] 99.791 kg (220 lb) (06/03 0735) Physical Exam  Constitutional: He is oriented to person, place, and time. He appears well-developed and well-nourished. He appears distressed.  HENT:  Head: Normocephalic.  Neck: Normal range of motion.  Cardiovascular: Normal rate and regular rhythm.   Respiratory: Effort normal and breath sounds normal.  GI: Soft. He exhibits no distension and no mass. There is no tenderness. There is no rebound and no guarding.  Musculoskeletal: Normal range of motion.  Neurological: He is alert and oriented to person, place, and time.  Skin: Skin is warm. He is diaphoretic.  Psychiatric: He has a normal mood and affect.    Laboratory Data:  Results for orders placed during the hospital encounter of 12/17/12 (from the past 24 hour(s))  CBC WITH DIFFERENTIAL     Status: Abnormal   Collection Time    12/17/12  9:31 AM      Result Value Range   WBC 10.7 (*) 4.0 - 10.5 K/uL   RBC 4.97  4.22 - 5.81 MIL/uL   Hemoglobin 14.6  13.0 - 17.0 g/dL   HCT 16.1  09.6 - 04.5 %   MCV 81.1  78.0 - 100.0 fL   MCH 29.4  26.0 - 34.0 pg   MCHC 36.2 (*) 30.0 - 36.0 g/dL   RDW 40.9  81.1 - 91.4 %   Platelets 222  150 - 400 K/uL   Neutrophils Relative % 81 (*)  43 - 77 %   Neutro Abs 8.7 (*) 1.7 - 7.7 K/uL   Lymphocytes Relative 14  12 - 46 %   Lymphs Abs 1.5  0.7 - 4.0 K/uL   Monocytes Relative 5  3 - 12 %   Monocytes Absolute 0.5  0.1 - 1.0 K/uL   Eosinophils Relative 0  0 - 5 %   Eosinophils Absolute 0.0  0.0 - 0.7 K/uL   Basophils Relative 1  0 - 1 %   Basophils Absolute 0.1  0.0 - 0.1 K/uL  BASIC METABOLIC PANEL     Status: Abnormal   Collection Time    12/17/12  9:31 AM      Result Value Range   Sodium 136  135 - 145 mEq/L   Potassium 3.2 (*) 3.5 - 5.1 mEq/L   Chloride 100  96 - 112 mEq/L   CO2 17 (*) 19 - 32 mEq/L   Glucose,  Bld 144 (*) 70 - 99 mg/dL   BUN 13  6 - 23 mg/dL   Creatinine, Ser 1.47  0.50 - 1.35 mg/dL   Calcium 9.5  8.4 - 82.9 mg/dL   GFR calc non Af Amer 75 (*) >90 mL/min   GFR calc Af Amer 87 (*) >90 mL/min   No results found for this or any previous visit (from the past 240 hour(s)). Creatinine:  Recent Labs  12/17/12 0931  CREATININE 1.16   Baseline Creatinine: 13/1.16  Impression/Assessment:  Left hydronephrosis and perinephric stranding secondary to 3.8 mm left proximal ureteral calculus.   Plan:  NPO Schedule cystoscopy, (L) ureteroscopy, (L) retrograde pyelogram, and placement of double J stent by Dr. Patsi Sears tonight. Will proceed with (L) ESWL 12/23/12. Risks and benefits of procedure discussed with both patient and wife which include: hematuria, infection, flank pain, urgency/frequency with stent, and possible bladder/ureteral injury. All questions addressed and answered to patient's satisfaction and wishes to proceed.  [facility] 12/17/2012, 2:37 PM

## 2012-12-17 NOTE — Interval H&P Note (Signed)
History and Physical Interval Note:  12/17/2012 5:41 PM  [name]  has presented today for surgery, with the diagnosis of left proximal stone  The various methods of treatment have been discussed with the patient and family. After consideration of risks, benefits and other options for treatment, the patient has consented to  Procedure(s): CYSTOSCOPY WITH RETROGRADE PYELOGRAM, URETEROSCOPY AND STENT PLACEMENT (Left) as a surgical intervention .  The patient's history has been reviewed, patient examined, no change in status, stable for surgery.  I have reviewed the patient's chart and labs.  Questions were answered to the patient's satisfaction.     [name]

## 2012-12-17 NOTE — ED Notes (Signed)
Went to assist patient and patient was alert and tremulous laying on the ground at [place] area.  Pt states he did not fall and he laid down on the ground.  Pt got up on his own and complaining of severe pain got on stretcher.  Pt is diaphoretic and complaining of left lower back pain that is shooting and it started this am.  Pt states he took 3 ibuprofen for the pain.  Pt reports lower back surgery one year ago, but denies any injury.  Pt transported to room A10 and report given to nurse.

## 2012-12-17 NOTE — ED Notes (Signed)
Dr. Sheldon at the bedside.  

## 2012-12-17 NOTE — OR Nursing (Signed)
Pt up to the restroom tol well

## 2012-12-17 NOTE — Op Note (Signed)
Pre-operative diagnosis :   Left upper ureteral calculus with hydronephrosis and renal colic  Postoperative diagnosis:   Same  Operation:   Cystourethroscopy, left retrograde PolyGram interpretation, left double-J stent (6 [place] by 26 cm)  Surgeon:  Kathie Rhodes. Patsi Sears, MD  First assistant:  None  Anesthesia:  General  LMA  Preparation:  After appropriate preanesthesia, the patient was brought to the operating room, placed on the operating table in the dorsal supine position where general LMA anesthesia was delivered. He was replaced in the dorsal lithotomy position with pubis was prepped with Betadine solution and draped in usual fashion. The arm band was double checked. The history was reviewed.  Review history:   , MD Physician Signed Urology H&P Service date: 12/17/2012 5:40 PM   History of Present Illness: 47 YO male seen today for complaint of left back pain X 2-3 weeks that progressively worsened this am to acute pain 9-10/10 with nausea/vomiting. Has chronic back pain and has hx of back surgery in 2013 but states this pain was much worse and did not improve with movement. Had 1 episode of "tea colored" urine but no gross hematuria. No previous hx of nephrolithiasis.    Past Medical History    Diagnosis  Date    .  Headache(784.0)     .  GERD (gastroesophageal reflux disease)     .  Rash        Statement of  Likelihood of Success: Excellent. TIME-OUT observed.:  Procedure:  Cystourethroscopy was accomplished. 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 The bladder base was within normal limits. The ureteral orifices were identified bilaterally. The left ureteral orifice was cannulated with a 6 [place] open-ended catheter, and left retrograde pyelogram revealed normal left lower ureter. Stone was identified by x-ray in the left upper ureter, and guidewire was passed around the stone into the renal pelvis and coiled. A was unable to visualize the stone without contrast, however. The stone  may have moved proximalward into the kidney. A 6 French by 26 cm double-J stent was then passed over the guidewire into the left renal pelvis and coiled. He was also coiled in the bladder. This was under fluoroscopic control. The patient received IV Toradol. He was awakened, and taken to recovery room in good condition. I noticed a large amount of bloody drainage from the left ureter, insinuating that the left ureter was unblocked.

## 2012-12-17 NOTE — ED Notes (Signed)
Spoke with Maisie Fus, RN from [place] concerning pt posting at [place]. Will keep pt comfortable here until receiving continued care call from [facility] concerning them in route.

## 2012-12-17 NOTE — ED Provider Notes (Signed)
History     CSN: [id number]  Arrival date & time 12/17/12  0727   First MD Initiated Contact with Patient 12/17/12 [phone]      Chief Complaint  Patient presents with  . Back Pain    (Consider location/radiation/quality/duration/timing/severity/associated sxs/prior treatment) Patient is a 47 y.o. male presenting with back pain.  Back Pain  Pt with history of prior lumbar microdiscectomy on the right reports he woke up from sleep a short while ago with severe aching L flank pain, associated with nausea and diaphoresis. Not worse with movement and no recent heavy lifting or falls. He states this pain is different from previous back pain. Took some ibuprofen with minimal improvement. He denies any fevers or hematuria except he had brown cloudy urine a few days ago that he attributed to eating baked beans.    Past Medical History  Diagnosis Date  . Headache(784.0)   . GERD (gastroesophageal reflux disease)   . Rash     Past Surgical History  Procedure Laterality Date  . Lumbar laminectomy/decompression microdiscectomy  01/17/2012    Procedure: LUMBAR LAMINECTOMY/DECOMPRESSION MICRODISCECTOMY;  Surgeon: Emilee Hero, MD;  Location: [facility] OR;  Service: Orthopedics;  Laterality: Right;  Right sided lumbar 5-sacrum 1 microdisectomy    No family history on file.  History  Substance Use Topics  . Smoking status: Never Smoker   . Smokeless tobacco: Not on file  . Alcohol Use: Yes     Comment: weekly      Review of Systems  Musculoskeletal: Positive for back pain.   All other systems reviewed and are negative except as noted in HPI.   Allergies  Review of patient's allergies indicates no known allergies.  Home Medications   Current Outpatient Rx  Name  Route  Sig  Dispense  Refill  . clotrimazole (LOTRIMIN) 1 % cream   Topical   Apply 1 application topically 2 (two) times daily.           BP 132/76  Pulse 60  Temp(Src) 97.2 F (36.2 C) (Oral)  Resp 22  Ht 5'  8" (1.727 m)  Wt 220 lb (99.791 kg)  BMI 33.46 kg/m2  SpO2 100%  Physical Exam  Nursing note and vitals reviewed. Constitutional: He is oriented to person, place, and time. He appears well-developed and well-nourished. He appears distressed.  HENT:  Head: Normocephalic and atraumatic.  Eyes: EOM are normal. Pupils are equal, round, and reactive to light.  Neck: Normal range of motion. Neck supple.  Cardiovascular: Normal rate, normal heart sounds and intact distal pulses.   Pulmonary/Chest: Effort normal and breath sounds normal.  Abdominal: Bowel sounds are normal. He exhibits no distension. There is no tenderness.  Musculoskeletal: Normal range of motion. He exhibits no edema and no tenderness (no lower back tenderness).  Neurological: He is alert and oriented to person, place, and time. He has normal strength. No cranial nerve deficit or sensory deficit.  Skin: Skin is warm. No rash noted. He is diaphoretic.  Psychiatric: He has a normal mood and affect.    ED Course  Procedures (including critical care time)  Labs Reviewed  CBC WITH DIFFERENTIAL - Abnormal; Notable for the following:    WBC 10.7 (*)    MCHC 36.2 (*)    Neutrophils Relative % 81 (*)    Neutro Abs 8.7 (*)    All other components within normal limits  BASIC METABOLIC PANEL - Abnormal; Notable for the following:    Potassium 3.2 (*)  CO2 17 (*)    Glucose, Bld 144 (*)    GFR calc non Af Amer 75 (*)    GFR calc Af Amer 87 (*)    All other components within normal limits  URINALYSIS, ROUTINE W REFLEX MICROSCOPIC   Ct Abdomen Pelvis Wo Contrast  12/17/2012   *RADIOLOGY REPORT*  Clinical Data: Left flank pain  CT ABDOMEN AND PELVIS WITHOUT CONTRAST  Technique:  Multidetector CT imaging of the abdomen and pelvis was performed following the standard protocol without intravenous contrast.  Comparison: None.  Findings: Sagittal images of the spine are unremarkable.  Lung bases are unremarkable.  Small hiatal hernia.   Unenhanced liver shows no biliary ductal dilatation.  No calcified gallstones are noted within gallbladder.  The unenhanced pancreas, spleen and adrenal glands are unremarkable.  Abdominal aorta is unremarkable.  There is mild left hydronephrosis and proximal left hydroureter. Axial image 42 there is 4 mm calcified obstructive calculus in the proximal left ureter.  The calculus is at the level of the lower endplate of the L3 vertebral body.  Mild left perinephric stranding.  Small amount of fluid/stranding noted in front of the left psoas muscle.  The right ureter is unremarkable.  No nephrolithiasis.  No small bowel obstruction.  No ascites or free air.  No adenopathy.  The terminal ileum is unremarkable.  Normal appendix is clearly visualized in axial image 57 bilateral distal ureter is unremarkable.  No calcified calculi are noted within urinary bladder.  Prostate gland measures 4.1 x 5.4 cm.  No inguinal adenopathy.  No destructive bony lesions are noted within pelvis. No distal colonic obstruction.  Probable small bilateral hydrocele. A few diverticula are noted right colon without evidence of acute diverticulitis.  IMPRESSION:  1.  There is mild left hydronephrosis and proximal left hydroureter. 2.  4 mm calcified obstructive calculus in proximal left ureter at the level of the lower endplate of the L3 vertebral body.  Mild left perinephric stranding. 3.  Normal appendix.  No pericecal inflammation.   Original Report Authenticated By: Natasha Mead, M.D.     1. Ureteral stone       MDM  Pt with continued moderate to severe pain, improved for short time with meds. Discussed with Dr. Patsi Sears who has reviewed his CT and recommends stent today and probably lithotripsy next week, so avoid toradol. He will accept the patient in transfer to [facility] Stay for pain control until OR is available later this afternoon. Pt and wife amenable to this plan.         Jiro Kiester B. Bernette Mayers, MD 12/17/12 1032

## 2012-12-17 NOTE — Transfer of Care (Signed)
Immediate Anesthesia Transfer of Care Note  Patient: [name]  Procedure(s) Performed: Procedure(s) (LRB): CYSTOSCOPY WITH RETROGRADE PYELOGRAM, URETEROSCOPY AND STENT PLACEMENT (Left)  Patient Location: PACU  Anesthesia Type: General  Level of Consciousness: sedated, patient cooperative and responds to stimulaton  Airway & Oxygen Therapy: Patient Spontanous Breathing and Patient connected to face mask oxgen  Post-op Assessment: Report given to PACU RN and Post -op Vital signs reviewed and stable  Post vital signs: Reviewed and stable  Complications: No apparent anesthesia complications

## 2012-12-17 NOTE — ED Notes (Signed)
Phlebotomy at the bedside  

## 2012-12-17 NOTE — Anesthesia Preprocedure Evaluation (Addendum)
Anesthesia Evaluation  Patient identified by MRN, date of birth, ID band Patient awake    Reviewed: Allergy & Precautions, H&P , NPO status , Patient's Chart, lab work & pertinent test results  Airway Mallampati: II TM Distance: >3 FB Neck ROM: Full    Dental no notable dental hx.    Pulmonary neg pulmonary ROS,  breath sounds clear to auscultation  Pulmonary exam normal       Cardiovascular Exercise Tolerance: Good negative cardio ROS  Rhythm:Regular Rate:Normal  ECG: 01-17-12 Normal.   Neuro/Psych  Headaches, negative psych ROS   GI/Hepatic Neg liver ROS, GERD-  ,  Endo/Other  negative endocrine ROS  Renal/GU negative Renal ROS  negative genitourinary   Musculoskeletal negative musculoskeletal ROS (+)   Abdominal   Peds negative pediatric ROS (+)  Hematology negative hematology ROS (+)   Anesthesia Other Findings   Reproductive/Obstetrics negative OB ROS                          Anesthesia Physical Anesthesia Plan  ASA: II and emergent  Anesthesia Plan: General   Post-op Pain Management:    Induction: Intravenous  Airway Management Planned: Oral ETT  Additional Equipment:   Intra-op Plan:   Post-operative Plan: Extubation in OR  Informed Consent: I have reviewed the patients History and Physical, chart, labs and discussed the procedure including the risks, benefits and alternatives for the proposed anesthesia with the patient or authorized representative who has indicated his/her understanding and acceptance.   Dental advisory given  Plan Discussed with: CRNA  Anesthesia Plan Comments:         Anesthesia Quick Evaluation

## 2012-12-17 NOTE — Consult Note (Signed)
Day of Surgery Subjective: Patient reports nausea, vomiting, flank pain severe and pain control poor. States he has hx of chronic back pain. Has now had 2-3 weeks of back pain (L) but this am pain became acute 9-10/10 with nausea/vomiting. Did have 1 episode of "tea" colored urine. No previous hx of nephrolithiasis but does have (+) FM of nephrolithiasis in sister. Denies passing stone.  Objective: Vital signs in last 24 hours: Temp:  [97 F (36.1 C)-97.8 F (36.6 C)] 97.8 F (36.6 C) (06/03 1310) Pulse Rate:  [60-74] 74 (06/03 1310) Resp:  [20-24] 20 (06/03 1310) BP: (123-161)/(76-101) 161/101 mmHg (06/03 1310) SpO2:  [97 %-100 %] 100 % (06/03 1310) Weight:  [99.791 kg (220 lb)] 99.791 kg (220 lb) (06/03 0735)  Intake/Output from previous day:   Intake/Output this shift: Total I/O In: 1000 [I.V.:1000] Out: -   Physical Exam:  General:alert, cooperative, moderate distress and mildly obese GI: soft, non tender, normal bowel sounds, no palpable masses, no organomegaly, no inguinal hernia. No CVA tenderness.  Male genitalia: not done not indicated Resp: clear to auscultation bilaterally Cardio: regular rate and rhythm, S1, S2 normal, no murmur, click, rub or gallop  Lab Results:  Recent Labs  12/17/12 0931  HGB 14.6  HCT 40.3   BMET  Recent Labs  12/17/12 0931  NA 136  K 3.2*  CL 100  CO2 17*  GLUCOSE 144*  BUN 13  CREATININE 1.16  CALCIUM 9.5   No results found for this basename: LABPT, INR,  in the last 72 hours No results found for this basename: LABURIN,  in the last 72 hours Results for orders placed during the hospital encounter of 01/16/12  SURGICAL PCR SCREEN     Status: Abnormal   Collection Time    01/16/12  9:11 AM      Result Value Range Status   MRSA, PCR NEGATIVE  NEGATIVE Final   Staphylococcus aureus POSITIVE (*) NEGATIVE Final   Comment:            The Xpert SA Assay (FDA     approved for NASAL specimens     only), is one component of      a comprehensive surveillance     program.  It is not intended     to diagnose infection nor to     guide or monitor treatment.    Studies/Results: Ct Abdomen Pelvis Wo Contrast  12/17/2012   *RADIOLOGY REPORT*  Clinical Data: Left flank pain  CT ABDOMEN AND PELVIS WITHOUT CONTRAST  Technique:  Multidetector CT imaging of the abdomen and pelvis was performed following the standard protocol without intravenous contrast.  Comparison: None.  Findings: Sagittal images of the spine are unremarkable.  Lung bases are unremarkable.  Small hiatal hernia.  Unenhanced liver shows no biliary ductal dilatation.  No calcified gallstones are noted within gallbladder.  The unenhanced pancreas, spleen and adrenal glands are unremarkable.  Abdominal aorta is unremarkable.  There is mild left hydronephrosis and proximal left hydroureter. Axial image 42 there is 4 mm calcified obstructive calculus in the proximal left ureter.  The calculus is at the level of the lower endplate of the L3 vertebral body.  Mild left perinephric stranding.  Small amount of fluid/stranding noted in front of the left psoas muscle.  The right ureter is unremarkable.  No nephrolithiasis.  No small bowel obstruction.  No ascites or free air.  No adenopathy.  The terminal ileum is unremarkable.  Normal appendix is clearly visualized  in axial image 57 bilateral distal ureter is unremarkable.  No calcified calculi are noted within urinary bladder.  Prostate gland measures 4.1 x 5.4 cm.  No inguinal adenopathy.  No destructive bony lesions are noted within pelvis. No distal colonic obstruction.  Probable small bilateral hydrocele. A few diverticula are noted right colon without evidence of acute diverticulitis.  IMPRESSION:  1.  There is mild left hydronephrosis and proximal left hydroureter. 2.  4 mm calcified obstructive calculus in proximal left ureter at the level of the lower endplate of the L3 vertebral body.  Mild left perinephric stranding. 3.   Normal appendix.  No pericecal inflammation.   Original Report Authenticated By: Natasha Mead, M.D.    Assessment/Plan: Kidney Stones Plan is to keep patient NPO at this time. Will be scheduled for cystoscopy, left ureteroscopy, left retrograde pyelogram, and placecment of double J stent by Dr. Patsi Sears later today and will proceed with (L) ESWL 12/23/12 by Dr. Patsi Sears.    LOS: 0 days   [facility] 12/17/2012, 2:31 PM

## 2012-12-17 NOTE — Anesthesia Postprocedure Evaluation (Signed)
  Anesthesia Post-op Note  Patient: [name]  Procedure(s) Performed: Procedure(s) (LRB): CYSTOSCOPY WITH RETROGRADE PYELOGRAM, URETEROSCOPY AND STENT PLACEMENT (Left)  Patient Location: PACU  Anesthesia Type: General  Level of Consciousness: awake and alert   Airway and Oxygen Therapy: Patient Spontanous Breathing  Post-op Pain: mild  Post-op Assessment: Post-op Vital signs reviewed, Patient's Cardiovascular Status Stable, Respiratory Function Stable, Patent Airway and No signs of Nausea or vomiting  Last Vitals:  Filed Vitals:   12/17/12 2000  BP:   Pulse:   Temp: 36.6 C  Resp:     Post-op Vital Signs: stable   Complications: No apparent anesthesia complications. Denies SOB. Lungs clear. Oxygen saturations 98% on room air. No signs nor symptoms of aspiration greater than 2.5 hours after induction/intubation. Discussed with patient. He is to return to ER if any problems. Discussed with Dr. Patsi Sears.

## 2012-12-17 NOTE — H&P (Signed)
History of Present Illness: 47 YO male seen today for complaint of left back pain X 2-3 weeks that progressively worsened this am to acute pain 9-10/10 with nausea/vomiting. Has chronic back pain and has hx of back surgery in 2013 but states this pain was much worse and did not improve with movement. Had 1 episode of "tea colored" urine but no gross hematuria. No previous hx of nephrolithiasis.  Past Medical History   Diagnosis  Date   .  Headache(784.0)    .  GERD (gastroesophageal reflux disease)    .  Rash     Past Surgical History   Procedure  Laterality  Date   .  Lumbar laminectomy/decompression microdiscectomy   01/17/2012     Procedure: LUMBAR LAMINECTOMY/DECOMPRESSION MICRODISCECTOMY; Surgeon: Emilee Hero, MD; Location: [facility] OR; Service: Orthopedics; Laterality: Right; Right sided lumbar 5-sacrum 1 microdisectomy    Home Medications:  Prescriptions prior to admission   Medication  Sig  Dispense  Refill   .  ibuprofen (ADVIL,MOTRIN) 200 MG tablet  Take 600 mg by mouth every 6 (six) hours as needed for pain.      Allergies: No Known Allergies  History reviewed. No pertinent family history.  Social History: reports that he has never smoked. He does not have any smokeless tobacco history on file. He reports that drinks alcohol. He reports that he does not use illicit drugs.  Review of Systems  Constitutional: Positive for chills.  HENT: Negative.  Eyes: Negative.  Respiratory: Negative.  Cardiovascular: Negative.  Gastrointestinal: Positive for nausea and vomiting.  Genitourinary: Positive for flank pain.  Musculoskeletal: Negative.  Skin: Negative.  Neurological: Negative.  Endo/Heme/Allergies: Negative.  Psychiatric/Behavioral: Negative.   Physical Exam:  Vital signs in last 24 hours:  Temp: [97 F (36.1 C)-97.8 F (36.6 C)] 97.8 F (36.6 C) (06/03 1310)  Pulse Rate: [60-74] 74 (06/03 1310)  Resp: [20-24] 20 (06/03 1310)  BP: (123-161)/(76-101) 161/101 mmHg  (06/03 1310)  SpO2: [97 %-100 %] 100 % (06/03 1310)  Weight: [99.791 kg (220 lb)] 99.791 kg (220 lb) (06/03 0735)  Physical Exam  Constitutional: He is oriented to person, place, and time. He appears well-developed and well-nourished. He appears distressed.  HENT:  Head: Normocephalic.  Neck: Normal range of motion.  Cardiovascular: Normal rate and regular rhythm.  Respiratory: Effort normal and breath sounds normal.  GI: Soft. He exhibits no distension and no mass. There is no tenderness. There is no rebound and no guarding.  Musculoskeletal: Normal range of motion.  Neurological: He is alert and oriented to person, place, and time.  Skin: Skin is warm. He is diaphoretic.  Psychiatric: He has a normal mood and affect.   Laboratory Data:  Results for orders placed during the hospital encounter of 12/17/12 (from the past 24 hour(s))   CBC WITH DIFFERENTIAL Status: Abnormal    Collection Time    12/17/12 9:31 AM   Result  Value  Range    WBC  10.7 (*)  4.0 - 10.5 K/uL    RBC  4.97  4.22 - 5.81 MIL/uL    Hemoglobin  14.6  13.0 - 17.0 g/dL    HCT  30.8  65.7 - 84.6 %    MCV  81.1  78.0 - 100.0 fL    MCH  29.4  26.0 - 34.0 pg    MCHC  36.2 (*)  30.0 - 36.0 g/dL    RDW  96.2  95.2 - 84.1 %    Platelets  222  150 - 400 K/uL    Neutrophils Relative %  81 (*)  43 - 77 %    Neutro Abs  8.7 (*)  1.7 - 7.7 K/uL    Lymphocytes Relative  14  12 - 46 %    Lymphs Abs  1.5  0.7 - 4.0 K/uL    Monocytes Relative  5  3 - 12 %    Monocytes Absolute  0.5  0.1 - 1.0 K/uL    Eosinophils Relative  0  0 - 5 %    Eosinophils Absolute  0.0  0.0 - 0.7 K/uL    Basophils Relative  1  0 - 1 %    Basophils Absolute  0.1  0.0 - 0.1 K/uL   BASIC METABOLIC PANEL Status: Abnormal    Collection Time    12/17/12 9:31 AM   Result  Value  Range    Sodium  136  135 - 145 mEq/L    Potassium  3.2 (*)  3.5 - 5.1 mEq/L    Chloride  100  96 - 112 mEq/L    CO2  17 (*)  19 - 32 mEq/L    Glucose, Bld  144 (*)  70 -  99 mg/dL    BUN  13  6 - 23 mg/dL    Creatinine, Ser  1.61  0.50 - 1.35 mg/dL    Calcium  9.5  8.4 - 10.5 mg/dL    GFR calc non Af Amer  75 (*)  >90 mL/min    GFR calc Af Amer  87 (*)  >90 mL/min    No results found for this or any previous visit (from the past 240 hour(s)).  Creatinine:   Recent Labs   12/17/12 0931   CREATININE  1.16    Baseline Creatinine: 13/1.16  Impression/Assessment:  Left hydronephrosis and perinephric stranding secondary to 3.8 mm left proximal ureteral calculus.  Plan:  NPO  Schedule cystoscopy, (L) ureteroscopy, (L) retrograde pyelogram, and placement of double J stent by Dr. Patsi Sears tonight. Will proceed with (L) ESWL 12/23/12. Risks and benefits of procedure discussed with both patient and wife which include: hematuria, infection, flank pain, urgency/frequency with stent, and possible bladder/ureteral injury. All questions addressed and answered to patient's

## 2012-12-17 NOTE — ED Notes (Signed)
Pt here for lower back pain to the left lower side that started this morning at 0600. Pt denies any problems urinating this morning when he went to the bath room. Has a hx of back surgery in 2013 on the right side. No hx of kidney stones.

## 2012-12-17 NOTE — ED Notes (Signed)
Urine sample requested.

## 2012-12-18 ENCOUNTER — Encounter (HOSPITAL_COMMUNITY): Payer: Self-pay | Admitting: Urology

## 2012-12-18 ENCOUNTER — Encounter (HOSPITAL_COMMUNITY): Payer: Self-pay | Admitting: Anesthesiology

## 2012-12-19 ENCOUNTER — Encounter (HOSPITAL_COMMUNITY): Payer: Self-pay | Admitting: *Deleted

## 2012-12-19 ENCOUNTER — Encounter (HOSPITAL_COMMUNITY): Payer: Self-pay | Admitting: Pharmacy Technician

## 2012-12-19 NOTE — Progress Notes (Addendum)
Pre ESWL Phone Call Spoke to patient via phone,history obtained,updated.  Patient was prescribed VICOPROFEN on 12/17/12 for pain post op. Pt understood he was not to take any ibuprofen 48 hours prior to ESWL and asked what to do. I sugessted that patient not take any more to the [facility] and call Dr Imelda Pillow office TODAY and request a different pain medication and he stated he would be doing that. He is als oconcerned that he has not had a BM since his surgery 12/17/12 and didn't want to wait until 12/22/12 for his laxative so I suggested he take a stool softener and he verbalized understanding He also has not received his blue folder from [facility] and he was told it was mailed and he should receive it today. He is to also ask about that when he calls office On the day of procedure he is to  bring blue folder,insurance cards,picture ID,designated driver and living will,POA, if desires (to be placed on chart).Reinforced no aspirin(instructions to hold aspirin per your doctor), ibuprofen products 72 hours prior to procedure. No vitamins or herbal medicines 7 days prior to procedure .   Follow laxative instructions provided by urologist (office) and in blue folder. Wear easy on/off clothing and no jewelry except wedding rings and ear rings. Leave all other valuables at home. Verbalizes understanding of instructions

## 2012-12-23 ENCOUNTER — Ambulatory Visit (HOSPITAL_COMMUNITY)
Admission: RE | Admit: 2012-12-23 | Discharge: 2012-12-23 | Disposition: A | Discharge status: Home / Self Care | Payer: Commercial Managed Care - PPO | Source: Ambulatory Visit | Attending: Urology | Admitting: Urology

## 2012-12-23 ENCOUNTER — Encounter (HOSPITAL_COMMUNITY)
Admission: RE | Disposition: A | Discharge status: Home / Self Care | Payer: Self-pay | Source: Ambulatory Visit | Attending: Urology

## 2012-12-23 ENCOUNTER — Encounter (HOSPITAL_COMMUNITY): Payer: Self-pay

## 2012-12-23 ENCOUNTER — Ambulatory Visit (HOSPITAL_COMMUNITY): Payer: Commercial Managed Care - PPO

## 2012-12-23 DIAGNOSIS — N200 Calculus of kidney: Secondary | ICD-10-CM | POA: Insufficient documentation

## 2012-12-23 DIAGNOSIS — K219 Gastro-esophageal reflux disease without esophagitis: Secondary | ICD-10-CM | POA: Insufficient documentation

## 2012-12-23 HISTORY — DX: Calculus of kidney: N20.0

## 2012-12-23 SURGERY — LITHOTRIPSY, ESWL
Anesthesia: LOCAL | Laterality: Left

## 2012-12-23 MED ORDER — CIPROFLOXACIN IN D5W 400 MG/200ML IV SOLN
400.0000 mg | Freq: Two times a day (BID) | INTRAVENOUS | Status: DC
  Administered 2012-12-23: 400 mg via INTRAVENOUS
  Filled 2012-12-23: qty 200

## 2012-12-23 MED ORDER — DIAZEPAM 5 MG/ML IJ SOLN
2.0000 mg | Freq: Once | INTRAMUSCULAR | Status: AC
  Administered 2012-12-23: 2 mg via INTRAVENOUS
  Filled 2012-12-23: qty 2

## 2012-12-23 MED ORDER — DEXTROSE-NACL 5-0.45 % IV SOLN
INTRAVENOUS | Status: DC
  Administered 2012-12-23: 07:00:00 via INTRAVENOUS

## 2012-12-23 MED ORDER — OXYCODONE-ACETAMINOPHEN 5-325 MG PO TABS
1.0000 | ORAL_TABLET | ORAL | Status: AC
  Administered 2012-12-23: 1 via ORAL
  Filled 2012-12-23: qty 1

## 2012-12-23 MED ORDER — DIPHENHYDRAMINE HCL 25 MG PO CAPS
25.0000 mg | ORAL_CAPSULE | ORAL | Status: DC
  Filled 2012-12-23: qty 1

## 2012-12-23 MED ORDER — CIPROFLOXACIN HCL 500 MG PO TABS
500.0000 mg | ORAL_TABLET | ORAL | Status: DC
  Filled 2012-12-23: qty 1

## 2012-12-23 MED ORDER — DIAZEPAM 5 MG PO TABS
10.0000 mg | ORAL_TABLET | ORAL | Status: DC
  Filled 2012-12-23: qty 2

## 2012-12-23 MED ORDER — DIPHENHYDRAMINE HCL 50 MG/ML IJ SOLN
12.5000 mg | Freq: Once | INTRAMUSCULAR | Status: AC
  Administered 2012-12-23: 12.5 mg via INTRAVENOUS
  Filled 2012-12-23: qty 1

## 2012-12-23 NOTE — Progress Notes (Signed)
Patient states he is unable to swallow pills w/o a bite of crackers. Will call Dr Karie Schwalbe

## 2012-12-23 NOTE — Interval H&P Note (Signed)
History and Physical Interval Note:  12/23/2012 7:48 AM  [name]  has presented today for surgery, with the diagnosis of LEFT PROXIMAL STONE   The various methods of treatment have been discussed with the patient and family. After consideration of risks, benefits and other options for treatment, the patient has consented to  Procedure(s): LEFT EXTRACORPOREAL SHOCK WAVE LITHOTRIPSY (ESWL) (Left) as a surgical intervention .  The patient's history has been reviewed, patient examined, no change in status, stable for surgery.  I have reviewed the patient's chart and labs.  Questions were answered to the patient's satisfaction.   Pt now post Left JJ stent and now for lithotripsy of L mid ureteral stone. KUB this AM shows L mid ureteral stone.   [name]

## 2012-12-23 NOTE — Progress Notes (Signed)
Patient c/o pain after urinating post litho. Called Dr. Patsi Sears and pain meds ordered.

## 2012-12-23 NOTE — H&P (Signed)
Related encounter: Admission (Discharged) from 12/17/2012 in [facility]   History of Present Illness: 47 YO male seen today for complaint of left back pain X 2-3 weeks that progressively worsened this am to acute pain 9-10/10 with nausea/vomiting. Has chronic back pain and has hx of back surgery in 2013 but states this pain was much worse and did not improve with movement. Had 1 episode of "tea colored" urine but no gross hematuria. No previous hx of nephrolithiasis.    Past Medical History    Diagnosis  Date    .  Headache(784.0)     .  GERD (gastroesophageal reflux disease)     .  Rash     Past Surgical History    Procedure  Laterality  Date    .  Lumbar laminectomy/decompression microdiscectomy   01/17/2012      Procedure: LUMBAR LAMINECTOMY/DECOMPRESSION MICRODISCECTOMY; Surgeon: Emilee Hero, MD; Location: [facility] OR; Service: Orthopedics; Laterality: Right; Right sided lumbar 5-sacrum 1 microdisectomy    Home Medications:    Prescriptions prior to admission    Medication  Sig  Dispense  Refill    .  ibuprofen (ADVIL,MOTRIN) 200 MG tablet  Take 600 mg by mouth every 6 (six) hours as needed for pain.      Allergies: No Known Allergies  History reviewed. No pertinent family history.  Social History: reports that he has never smoked. He does not have any smokeless tobacco history on file. He reports that drinks alcohol. He reports that he does not use illicit drugs.  Review of Systems  Constitutional: Positive for chills.  HENT: Negative.  Eyes: Negative.  Respiratory: Negative.  Cardiovascular: Negative.  Gastrointestinal: Positive for nausea and vomiting.  Genitourinary: Positive for flank pain.  Musculoskeletal: Negative.  Skin: Negative.  Neurological: Negative.  Endo/Heme/Allergies: Negative.  Psychiatric/Behavioral: Negative.  Physical Exam:  Vital signs in last 24 hours:  Temp: [97 F (36.1 C)-97.8 F (36.6 C)] 97.8 F (36.6 C) (06/03 1310)  Pulse  Rate: [60-74] 74 (06/03 1310)  Resp: [20-24] 20 (06/03 1310)  BP: (123-161)/(76-101) 161/101 mmHg (06/03 1310)  SpO2: [97 %-100 %] 100 % (06/03 1310)  Weight: [99.791 kg (220 lb)] 99.791 kg (220 lb) (06/03 0735)  Physical Exam  Constitutional: He is oriented to person, place, and time. He appears well-developed and well-nourished. He appears distressed.  HENT:  Head: Normocephalic.  Neck: Normal range of motion.  Cardiovascular: Normal rate and regular rhythm.  Respiratory: Effort normal and breath sounds normal.  GI: Soft. He exhibits no distension and no mass. There is no tenderness. There is no rebound and no guarding.  Musculoskeletal: Normal range of motion.  Neurological: He is alert and oriented to person, place, and time.  Skin: Skin is warm. He is diaphoretic.  Psychiatric: He has a normal mood and affect.  Laboratory Data:    Results for orders placed during the hospital encounter of 12/17/12 (from the past 24 hour(s))    CBC WITH DIFFERENTIAL Status: Abnormal     Collection Time     12/17/12 9:31 AM    Result  Value  Range     WBC  10.7 (*)  4.0 - 10.5 K/uL     RBC  4.97  4.22 - 5.81 MIL/uL     Hemoglobin  14.6  13.0 - 17.0 g/dL     HCT  98.1  19.1 - 47.8 %     MCV  81.1  78.0 - 100.0 fL  MCH  29.4  26.0 - 34.0 pg     MCHC  36.2 (*)  30.0 - 36.0 g/dL     RDW  16.1  09.6 - 04.5 %     Platelets  222  150 - 400 K/uL     Neutrophils Relative %  81 (*)  43 - 77 %     Neutro Abs  8.7 (*)  1.7 - 7.7 K/uL     Lymphocytes Relative  14  12 - 46 %     Lymphs Abs  1.5  0.7 - 4.0 K/uL     Monocytes Relative  5  3 - 12 %     Monocytes Absolute  0.5  0.1 - 1.0 K/uL     Eosinophils Relative  0  0 - 5 %     Eosinophils Absolute  0.0  0.0 - 0.7 K/uL     Basophils Relative  1  0 - 1 %     Basophils Absolute  0.1  0.0 - 0.1 K/uL    BASIC METABOLIC PANEL Status: Abnormal     Collection Time     12/17/12 9:31 AM    Result  Value  Range     Sodium  136  135 - 145 mEq/L      Potassium  3.2 (*)  3.5 - 5.1 mEq/L     Chloride  100  96 - 112 mEq/L     CO2  17 (*)  19 - 32 mEq/L     Glucose, Bld  144 (*)  70 - 99 mg/dL     BUN  13  6 - 23 mg/dL     Creatinine, Ser  4.09  0.50 - 1.35 mg/dL     Calcium  9.5  8.4 - 10.5 mg/dL     GFR calc non Af Amer  75 (*)  >90 mL/min     GFR calc Af Amer  87 (*)  >90 mL/min    No results found for this or any previous visit (from the past 240 hour(s)).  Creatinine:  Recent Labs      12/17/12 0931     CREATININE  1.16     Baseline Creatinine: 13/1.16  Impression/Assessment:  Left hydronephrosis and perinephric stranding secondary to 3.8 mm left proximal ureteral calculus.  Plan:  NPO  Schedule cystoscopy, (L) ureteroscopy, (L) retrograde pyelogram, and placement of double J stent by Dr. Patsi Sears tonight. Will proceed with (L) ESWL 12/23/12. Risks and benefits of procedure discussed with both patient and wife which include: hematuria, infection, flank pain, urgency/frequency with stent, and possible bladder/ureteral injury. All questions addressed and answered to patient's

## 2013-06-09 ENCOUNTER — Encounter: Payer: Self-pay | Admitting: Physician Assistant

## 2013-06-09 ENCOUNTER — Other Ambulatory Visit: Payer: Self-pay | Admitting: Physician Assistant

## 2013-06-09 ENCOUNTER — Ambulatory Visit (INDEPENDENT_AMBULATORY_CARE_PROVIDER_SITE_OTHER): Payer: 59 | Admitting: Physician Assistant

## 2013-06-09 VITALS — BP 110/80 | HR 83 | Temp 98.6°F | Resp 16 | Ht 69.0 in | Wt 203.0 lb

## 2013-06-09 DIAGNOSIS — E039 Hypothyroidism, unspecified: Secondary | ICD-10-CM

## 2013-06-09 DIAGNOSIS — N529 Male erectile dysfunction, unspecified: Secondary | ICD-10-CM

## 2013-06-09 DIAGNOSIS — J339 Nasal polyp, unspecified: Secondary | ICD-10-CM

## 2013-06-09 DIAGNOSIS — Z0000 Encounter for general adult medical examination without abnormal findings: Secondary | ICD-10-CM

## 2013-06-09 LAB — CBC
MCHC: 34.6 g/dL (ref 30.0–36.0)
MCV: 82.9 fL (ref 78.0–100.0)
Platelets: 244 10*3/uL (ref 150–400)
RDW: 13.2 % (ref 11.5–15.5)
WBC: 8.8 10*3/uL (ref 4.0–10.5)

## 2013-06-09 LAB — POCT UA - MICROSCOPIC ONLY
Casts, Ur, LPF, POC: NEGATIVE
Crystals, Ur, HPF, POC: NEGATIVE

## 2013-06-09 LAB — COMPREHENSIVE METABOLIC PANEL
ALT: 12 U/L (ref 0–53)
AST: 15 U/L (ref 0–37)
Alkaline Phosphatase: 67 U/L (ref 39–117)
Calcium: 9.7 mg/dL (ref 8.4–10.5)
Chloride: 101 mEq/L (ref 96–112)
Creat: 1.09 mg/dL (ref 0.50–1.35)

## 2013-06-09 LAB — LIPID PANEL
HDL: 45 mg/dL (ref >39)
LDL Cholesterol: 113 mg/dL — ABNORMAL HIGH (ref 0–99)
Total CHOL/HDL Ratio: 4.2 Ratio
VLDL: 32 mg/dL (ref 0–40)

## 2013-06-09 LAB — POCT URINALYSIS DIPSTICK
Leukocytes, UA: NEGATIVE
Protein, UA: NEGATIVE
Urobilinogen, UA: 0.2
pH, UA: 7

## 2013-06-09 MED ORDER — SILDENAFIL CITRATE 50 MG PO TABS: 50.0000 mg | ORAL_TABLET | ORAL | Status: DC | PRN

## 2013-06-09 NOTE — Progress Notes (Signed)
Patient ID: [name] MRN: [id number], DOB: 09-24-1965 47 y.o. Date of Encounter: 06/09/2013, 2:11 PM  Primary Physician: Pcp Not In System  Chief Complaint: Physical (CPE)  HPI: 47 y.o. male with history noted below here for CPE. Doing well. Last physical was 8 years ago. He would like to discuss a couple issues.   1) Erectile dysfunction: He and his wife are trying to have a child and he notes that he is having a difficult time keeping an erection. His erection is not quite a firm as it used to be. He states during intercourse he will begin to think about this and he will lose the erection. He has always been self conscious about sex and states this is not helping that matter. He states if they were not trying to have a child he would not care about this, but they want to have a child. He does not want CBT. He is interested in Viagra.   2) Back pain: Long history of back pain. Had a L5-S1 microdiskectomy on 01/17/12. Since under going this procedure and instituting some lifestyle changes he has noticed that his back pain has improved quite a bit. He is eating healthier, exercising, no longer drinking beer daily, and does not use cocaine during basketball season. He last used cocaine 2.5-3 years ago. States that he feels healthier.   3) CPE: Has already received his influenza vaccine. See above. He did have a kidney stone this past summer that had to under go lithotripsy and ureteral stenting for removal. Stone was 4 mm. No issues since changing his diet.   4) At the close of our visit he asks about having several skin tags removed from around his left eye and along his receeding hair line. I informed him that due to time issues I could not do this today, but I would be happy to at a follow up visit.    Review of Systems: Consitutional: No fever, chills, fatigue, night sweats, lymphadenopathy, or weight changes. Eyes: No visual changes, eye redness, or discharge. ENT/Mouth: Ears: No otalgia,  tinnitus, hearing loss, discharge. Nose: No congestion, rhinorrhea, sinus pain, or epistaxis. Throat: No sore throat, post nasal drip, or teeth pain. Cardiovascular: No CP, palpitations, diaphoresis, DOE, edema, orthopnea, PND. Respiratory: No cough, hemoptysis, SOB, or wheezing. Gastrointestinal: No anorexia, dysphagia, reflux, pain, nausea, vomiting, hematemesis, diarrhea, constipation, BRBPR, or melena. Genitourinary: No dysuria, frequency, urgency, hematuria, incontinence, nocturia, decreased urinary stream, discharge, impotence, or testicular pain/masses. Musculoskeletal: Positive for back pain. No decreased ROM, myalgias, stiffness, joint swelling, or weakness. Skin: No rash, erythema, lesion changes, pain, warmth, jaundice, or pruritis. Neurological: No headache, dizziness, syncope, seizures, tremors, memory loss, coordination problems, or paresthesias. Psychological: No anxiety, depression, hallucinations, SI/HI. Endocrine: No fatigue, polydipsia, polyphagia, polyuria, or known diabetes.   Past Medical History  Diagnosis Date  . Headache(784.0)   . GERD (gastroesophageal reflux disease)   . Rash   . Kidney stones      Past Surgical History  Procedure Laterality Date  . Lumbar laminectomy/decompression microdiscectomy  01/17/2012    Procedure: LUMBAR LAMINECTOMY/DECOMPRESSION MICRODISCECTOMY;  Surgeon: Emilee Hero, MD;  Location: [facility] OR;  Service: Orthopedics;  Laterality: Right;  Right sided lumbar 5-sacrum 1 microdisectomy  . Cystoscopy with retrograde pyelogram, ureteroscopy and stent placement Left 12/17/2012    Procedure: CYSTOSCOPY WITH RETROGRADE PYELOGRAM, URETEROSCOPY AND STENT PLACEMENT;  Surgeon: Kathi Ludwig, MD;  Location: [facility] ORS;  Service: Urology;  Laterality: Left;    Home Meds:  Prior to Admission medications   Medication Sig Start Date End Date Taking? Authorizing Provider                                                             Allergies: No Known Allergies  History   Social History  . Marital Status: Married    Spouse Name: N/A    Number of Children: N/A  . Years of Education: N/A   Occupational History  . Not on file.   Social History Main Topics  . Smoking status: Never Smoker   . Smokeless tobacco: Not on file  . Alcohol Use: Yes     Comment: weekly  . Drug Use: No  . Sexual Activity: Not on file   Other Topics Concern  . Not on file   Social History Narrative  . No narrative on file    History reviewed. No pertinent family history.  Physical Exam: Blood pressure 110/80, pulse 83, temperature 98.6 F (37 C), temperature source Oral, resp. rate 16, height 5\' 9"  (1.753 m), weight 203 lb (92.08 kg), SpO2 95.00%.  General: Well developed, well nourished, in no acute distress. HEENT: Normocephalic, atraumatic. Conjunctiva pink, sclera non-icteric. Pupils 2 mm constricting to 1 mm, round, regular, and equally reactive to light and accomodation. EOMI. Internal auditory canal clear. TMs with good cone of light and without pathology. Nasal mucosa pink. There is a nasal polyp along the opening of the left nare. He states it was this nare that he used mostly to snort cocaine. Nares are without discharge. No sinus tenderness. Oral mucosa pink. Dentition normal. Pharynx without exudate.   Neck: Supple. Trachea midline. No thyromegaly. Full ROM. No lymphadenopathy. Lungs: Clear to auscultation bilaterally without wheezes, rales, or rhonchi. Breathing is of normal effort and unlabored. Cardiovascular: RRR with S1 S2. No murmurs, rubs, or gallops appreciated. Distal pulses 2+ symmetrically. No carotid or abdominal bruits. Abdomen: Soft, non-tender, non-distended with normoactive bowel sounds. No hepatosplenomegaly or masses. No rebound/guarding. No CVA tenderness. Without hernias.  Rectal: No external hemorrhoids or fissures. Rectal vault without masses. Prostate smooth, not enlarged, no nodules, no TTP.    Genitourinary: Circumcised male. No penile lesions. Testes descended bilaterally, and smooth without tenderness or masses.  Musculoskeletal: Full range of motion and 5/5 strength throughout. Without swelling, atrophy, tenderness, crepitus, or warmth. Extremities without clubbing, cyanosis, or edema. Calves supple. Skin: Warm and moist without erythema, ecchymosis, wounds, or rash. Neuro: A+Ox3. CN II-XII grossly intact. Moves all extremities spontaneously. Full sensation throughout. Normal gait. DTR 2+ throughout upper and lower extremities. Finger to nose intact. Psych:  Responds to questions appropriately with a normal affect.   Studies:  Results for orders placed in visit on 06/09/13  POCT UA - MICROSCOPIC ONLY      Result Value Range   WBC, Ur, HPF, POC 0-2     RBC, urine, microscopic neg     Bacteria, U Microscopic neg     Mucus, UA trace     Epithelial cells, urine per micros 0-2     Crystals, Ur, HPF, POC neg     Casts, Ur, LPF, POC neg     Yeast, UA neg    POCT URINALYSIS DIPSTICK      Result Value Range   Color, UA yellow  Clarity, UA clear     Glucose, UA neg     Bilirubin, UA neg     Ketones, UA neg     Spec Grav, UA 1.020     Blood, UA neg     pH, UA 7.0     Protein, UA neg     Urobilinogen, UA 0.2     Nitrite, UA neg     Leukocytes, UA Negative       CBC, CMET, Lipid, PSA, TSH all pending. Patient is fasting.   Assessment/Plan:  47 y.o. male here for CPE with longstanding back pain, erectile dysfunction, and prior cocaine use  1) Longstanding back pain -Much improved status post L5-S1 microdiskectomy  -Continue lifestyle changes -Regular exercise -Weight loss  2) Erectile dysfunction -Trial of Viagra 50 mg 1 po 1 hour prior to intercourse #8 RF 6 -Offered counseling, he declined  3) Prior cocaine use -Clean now for 2.5-3 years  -No beta blocker usage if he ever becomes hypertensive in case of a relapse   4) CPE -Nasal polyp-will refer to ENT  for further evaluation secondary to this was the nare he used for cocaine -Await labs -Has already received his influenza vaccine -Healthy diet and exercise -Weight loss -Age appropriate anticipatory guidance   Signed, Eula Listen, PA-C [facility] [place], [place] [address] [phone] 06/09/2013 2:11 PM

## 2013-06-09 NOTE — Progress Notes (Signed)
  Subjective:    Patient ID: [name], male    DOB: 05-01-1966, 47 y.o.   MRN: [id number]  HPI    Review of Systems  Constitutional: Negative.   HENT: Negative.   Eyes: Negative.   Respiratory: Negative.   Cardiovascular: Negative.   Gastrointestinal: Negative.   Endocrine: Negative.   Genitourinary: Negative.   Musculoskeletal: Negative.   Skin: Negative.   Allergic/Immunologic: Negative.   Neurological: Negative.   Hematological: Negative.   Psychiatric/Behavioral: Negative.        Objective:   Physical Exam        Assessment & Plan:

## 2013-06-10 LAB — BILIRUBIN, FRACTIONATED(TOT/DIR/INDIR): Indirect Bilirubin: 0.9 mg/dL (ref 0.0–0.9)

## 2013-06-10 LAB — TSH: TSH: 4.799 u[IU]/mL — ABNORMAL HIGH (ref 0.350–4.500)

## 2013-06-10 LAB — PSA: PSA: 0.56 ng/mL (ref <=4.00)

## 2013-06-10 NOTE — Addendum Note (Signed)
Addended by: Sondra Barges on: 06/10/2013 07:47 AM   Modules accepted: Orders

## 2013-06-16 ENCOUNTER — Encounter: Payer: Self-pay | Admitting: Physician Assistant

## 2013-06-16 ENCOUNTER — Ambulatory Visit (INDEPENDENT_AMBULATORY_CARE_PROVIDER_SITE_OTHER): Payer: 59 | Admitting: Physician Assistant

## 2013-06-16 VITALS — BP 130/76 | HR 61 | Temp 98.0°F | Resp 16 | Ht 70.0 in | Wt 209.0 lb

## 2013-06-16 DIAGNOSIS — B079 Viral wart, unspecified: Secondary | ICD-10-CM

## 2013-06-16 DIAGNOSIS — B078 Other viral warts: Secondary | ICD-10-CM

## 2013-06-16 NOTE — Progress Notes (Signed)
   Patient ID: [name] MRN: [id number], DOB: 1965-08-08, 47 y.o. Date of Encounter: 06/16/2013, 11:33 AM  Primary Physician: Pcp Not In System  Chief Complaint: Skin tag removal  HPI: 47 y.o. male with history below presents for possible verruca removal. Patient with several skin tags along his left eye and his receeding hair line. Patient states the lesions along his left lateral eye and right forehead have been present for 8-10 years. He then developed the smaller lesions over subsequent years. He is concerned they might be warts vs cancer. He states his wife has a similar appearing lesions along her upper lip.    Past Medical History  Diagnosis Date  . Headache(784.0)   . GERD (gastroesophageal reflux disease)   . Rash   . Kidney stones      Home Meds: Prior to Admission medications   Medication Sig Start Date End Date Taking? Authorizing Provider  sildenafil (VIAGRA) 50 MG tablet Take 1 tablet (50 mg total) by mouth as needed for erectile dysfunction. 06/09/13   Sondra Barges, PA-C    Allergies: No Known Allergies  History   Social History  . Marital Status: Married    Spouse Name: N/A    Number of Children: N/A  . Years of Education: N/A   Occupational History  . Not on file.   Social History Main Topics  . Smoking status: Never Smoker   . Smokeless tobacco: Not on file  . Alcohol Use: Yes     Comment: weekly  . Drug Use: No  . Sexual Activity: Not on file   Other Topics Concern  . Not on file   Social History Narrative  . No narrative on file     Review of Systems: Constitutional: negative for chills, fever, or fatigue  HEENT: negative for vision changes or hearing loss Dermatological: see above Neurologic: negative for headache   Physical Exam: There were no vitals taken for this visit., There is no weight on file to calculate BMI. General: Well developed, well nourished, in no acute distress. Head: Normocephalic, atraumatic, eyes without  discharge, sclera non-icteric, nares are without discharge.   Neck: Supple. Full ROM.  Lungs: Breathing is unlabored. Heart: Regular rate. Msk:  Strength and tone normal for age. Extremities/Skin: Warm and dry. No clubbing or cyanosis. No edema. No rashes. Multiple verruca like lesions along the forehead. The largest are located along the left lateral eye and right forehead. No secondary infection.  Neuro: Alert and oriented X 3. Moves all extremities spontaneously. Gait is normal. CNII-XII grossly in tact. Psych:  Responds to questions appropriately with a normal affect.     PROCEDURE NOTE: Verbal consent obtained. Sterile technique employed.  Betadine prep per usual protocol.  1% lidocaine with epi 0.5 cc for local anesthesia along lesion at left lateral eye and right forehead. Lesions removed along left lateral eye and right forehead with 10 blade and a pair of pick ups. Placed in pathology transport container  Hemostasis obtained. Wound dressed. Wound care. Patient tolerated procedure well.    ASSESSMENT AND PLAN:  47 y.o. male here for skin tag removal -Skin tags removed per above -Sent for path review -Await results, further evaluation and treatment pending -Wound care   Signed, Eula Listen, PA-C [facility] [place], [place] [address] [phone] 06/16/2013 11:33 AM

## 2013-10-08 ENCOUNTER — Ambulatory Visit: Payer: 59 | Admitting: Emergency Medicine

## 2013-10-08 VITALS — BP 120/86 | HR 69 | Temp 98.0°F | Resp 18 | Ht 69.0 in | Wt 214.4 lb

## 2013-10-08 DIAGNOSIS — J020 Streptococcal pharyngitis: Secondary | ICD-10-CM

## 2013-10-08 MED ORDER — PENICILLIN V POTASSIUM 500 MG PO TABS: 500.0000 mg | ORAL_TABLET | Freq: Four times a day (QID) | ORAL | Status: DC

## 2013-10-08 NOTE — Progress Notes (Signed)
[facility] [address], [place] [id number] [phone]  Date:  10/08/2013   Name:  Dennis ClevelandRobert M Ferguson   DOB:  07/20/1965   MRN:  [id number]  PCP:  Pcp Not In System    Chief Complaint: Sore Throat   History of Present Illness:  [name] is a 48 y.o. very pleasant male patient who presents with the following:  Ill with sore throat started yesterday.  Malaise, myalgia and fatigue. No fever or chills. No cough or coryza.  No nausea or vomiting or diarrhea.  No improvement with over the counter medications or other home remedies. Denies other complaint or health concern today.   There are no active problems to display for this patient.   Past Medical History  Diagnosis Date  . Headache(784.0)   . GERD (gastroesophageal reflux disease)   . Rash   . Kidney stones     Past Surgical History  Procedure Laterality Date  . Lumbar laminectomy/decompression microdiscectomy  01/17/2012    Procedure: LUMBAR LAMINECTOMY/DECOMPRESSION MICRODISCECTOMY;  Surgeon: Emilee HeroMark Leonard Dumonski, MD;  [facility];  Service: Orthopedics;  Laterality: Right;  Right sided lumbar 5-sacrum 1 microdisectomy  . Cystoscopy with retrograde pyelogram, ureteroscopy and stent placement Left 12/17/2012    Procedure: CYSTOSCOPY WITH RETROGRADE PYELOGRAM, URETEROSCOPY AND STENT PLACEMENT;  Surgeon: Kathi LudwigSigmund I Tannenbaum, MD;  Location: [facility] ORS;  Service: Urology;  Laterality: Left;    History  Substance Use Topics  . Smoking status: Never Smoker   . Smokeless tobacco: Not on file  . Alcohol Use: Yes     Comment: weekly    History reviewed. No pertinent family history.  No Known Allergies  Medication list has been reviewed and updated.  Current Outpatient Prescriptions on File Prior to Visit  Medication Sig Dispense Refill  . sildenafil (VIAGRA) 50 MG tablet Take 1 tablet (50 mg total) by mouth as needed for erectile dysfunction.  8 tablet  6   No current facility-administered  medications on file prior to visit.    Review of Systems:  As per HPI, otherwise negative.    Physical Examination: Filed Vitals:   10/08/13 0905  BP: 120/86  Pulse: 69  Temp: 98 F (36.7 C)  Resp: 18   Filed Vitals:   10/08/13 0905  Height: 5\' 9"  (1.753 m)  Weight: 214 lb 6.4 oz (97.251 kg)   Body mass index is 31.65 kg/(m^2). Ideal Body Weight: Weight in (lb) to have BMI = 25: 168.9  GEN: WDWN, NAD, Non-toxic, A & O x 3 HEENT: Atraumatic, Normocephalic. Neck supple. No masses, No LAD.  Erythematous pharynx Ears and Nose: No external deformity. CV: RRR, No M/G/R. No JVD. No thrill. No extra heart sounds. PULM: CTA B, no wheezes, crackles, rhonchi. No retractions. No resp. distress. No accessory muscle use. ABD: S, NT, ND, +BS. No rebound. No HSM. EXTR: No c/c/e NEURO Normal gait.  PSYCH: Normally interactive. Conversant. Not depressed or anxious appearing.  Calm demeanor.    Assessment and Plan: Strep Pen vk  Signed,  Phillips OdorJeffery Asmi Fugere, MD

## 2013-10-08 NOTE — Patient Instructions (Signed)
Strep Throat  Strep throat is an infection of the throat caused by a bacteria named Streptococcus pyogenes. Your caregiver may call the infection streptococcal "tonsillitis" or "pharyngitis" depending on whether there are signs of inflammation in the tonsils or back of the throat. Strep throat is most common in children aged 46 15 years during the cold months of the year, but it can occur in people of any age during any season. This infection is spread from person to person (contagious) through coughing, sneezing, or other close contact.  SYMPTOMS   · Fever or chills.  · Painful, swollen, red tonsils or throat.  · Pain or difficulty when swallowing.  · White or yellow spots on the tonsils or throat.  · Swollen, tender lymph nodes or "glands" of the neck or under the jaw.  · Red rash all over the body (rare).  DIAGNOSIS   Many different infections can cause the same symptoms. A test must be done to confirm the diagnosis so the right treatment can be given. A "rapid strep test" can help your caregiver make the diagnosis in a few minutes. If this test is not available, a light swab of the infected area can be used for a throat culture test. If a throat culture test is done, results are usually available in a day or two.  TREATMENT   Strep throat is treated with antibiotic medicine.  HOME CARE INSTRUCTIONS   · Gargle with 1 tsp of salt in 1 cup of warm water, 3 4 times per day or as needed for comfort.  · Family members who also have a sore throat or fever should be tested for strep throat and treated with antibiotics if they have the strep infection.  · Make sure everyone in your household washes their hands well.  · Do not share food, drinking cups, or personal items that could cause the infection to spread to others.  · You may need to eat a soft food diet until your sore throat gets better.  · Drink enough water and fluids to keep your urine clear or pale yellow. This will help prevent dehydration.  · Get plenty of  rest.  · Stay home from school, daycare, or work until you have been on antibiotics for 24 hours.  · Only take over-the-counter or prescription medicines for pain, discomfort, or fever as directed by your caregiver.  · If antibiotics are prescribed, take them as directed. Finish them even if you start to feel better.  SEEK MEDICAL CARE IF:   · The glands in your neck continue to enlarge.  · You develop a rash, cough, or earache.  · You cough up green, yellow-brown, or bloody sputum.  · You have pain or discomfort not controlled by medicines.  · Your problems seem to be getting worse rather than better.  SEEK IMMEDIATE MEDICAL CARE IF:   · You develop any new symptoms such as vomiting, severe headache, stiff or painful neck, chest pain, shortness of breath, or trouble swallowing.  · You develop severe throat pain, drooling, or changes in your voice.  · You develop swelling of the neck, or the skin on the neck becomes red and tender.  · You have a fever.  · You develop signs of dehydration, such as fatigue, dry mouth, and decreased urination.  · You become increasingly sleepy, or you cannot wake up completely.  Document Released: 06/30/2000 Document Revised: 06/19/2012 Document Reviewed: 09/01/2010  [employer]® Patient Information ©2014 [employer].

## 2014-01-19 IMAGING — CT CT ABD-PELV W/O CM
2 of 4 series · 17 of 46 positions shown, 19 images · non-contrast
Comparison: None.

CLINICAL DATA: Left flank pain

CT ABDOMEN AND PELVIS WITHOUT CONTRAST
TECHNIQUE: Multidetector CT imaging of the abdomen and pelvis was
performed following the standard protocol without intravenous
contrast.

[Series 2: stone >200 lbs 5.0 b31f st · axial · 0.77mm/px · z∈[-502,-82]mm · 14 of 92 slices shown, 16 images]
[im 4/92  soft-tissue]
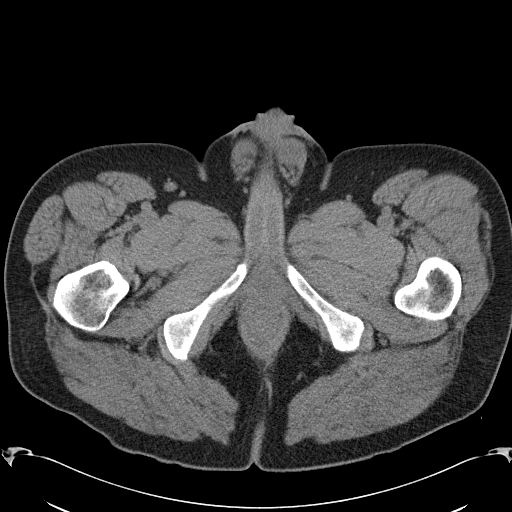
[im 4/92  bone]
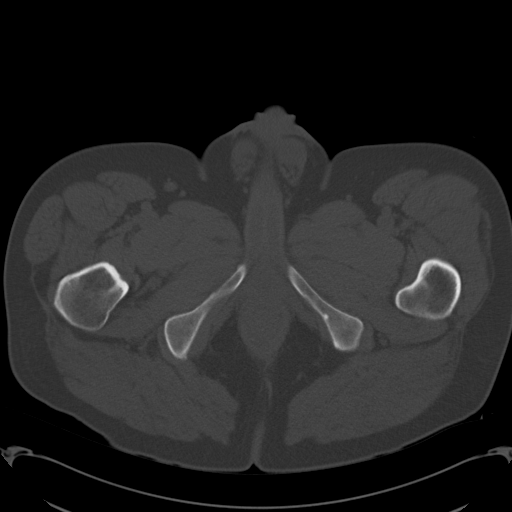
[im 12/92  soft-tissue]
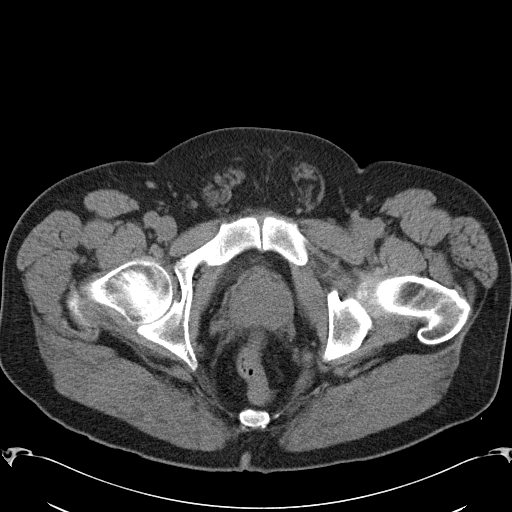
[im 19/92  soft-tissue]
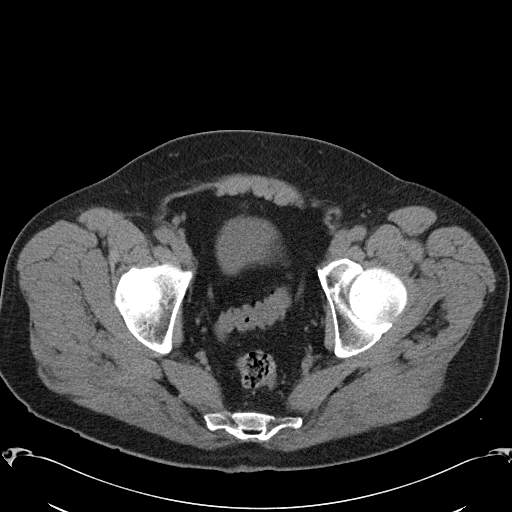
[im 23/92  soft-tissue]
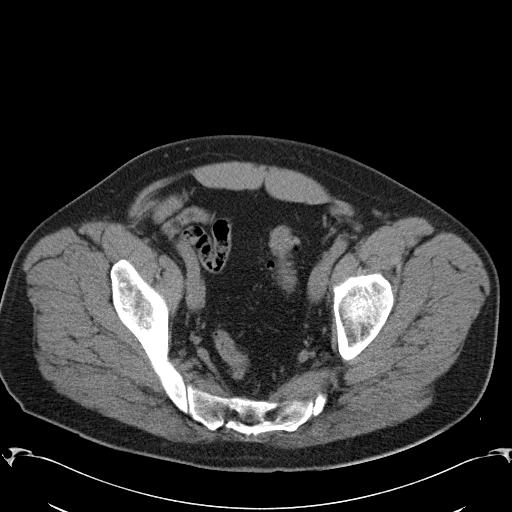
[im 31/92  soft-tissue]
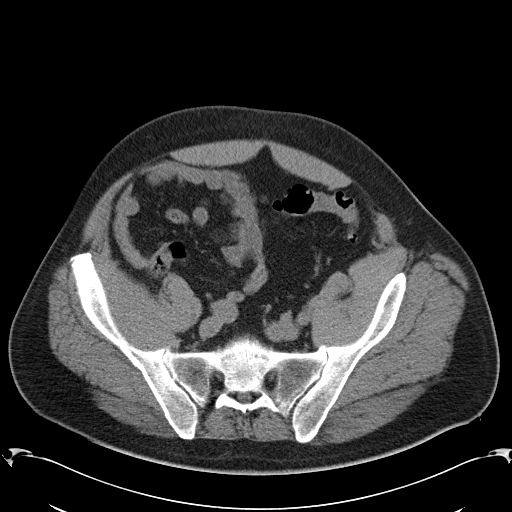
[im 38/92  soft-tissue]
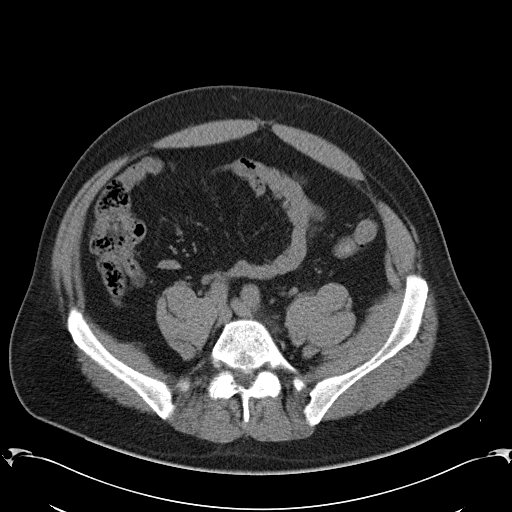
[im 42/92  soft-tissue]
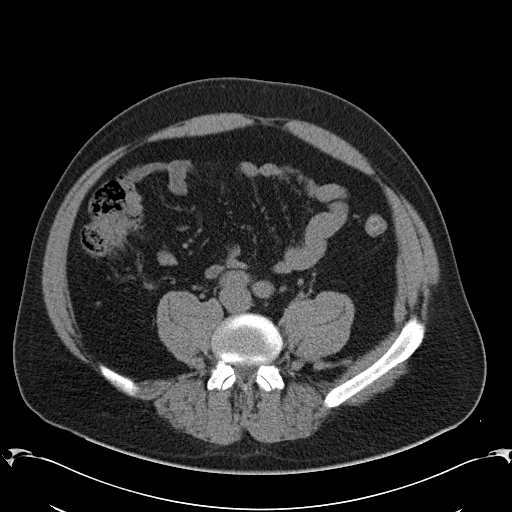
[im 50/92  soft-tissue]
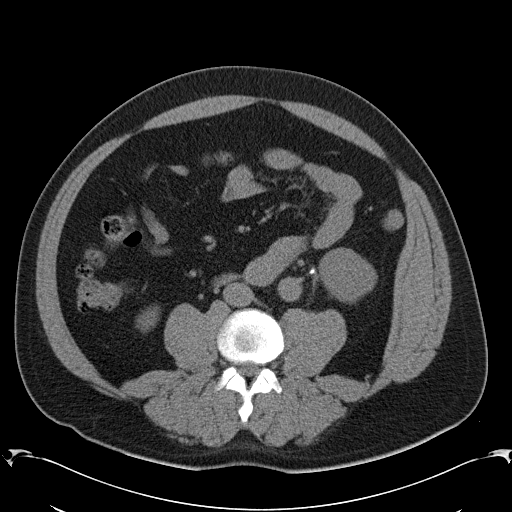
[im 54/92  soft-tissue]
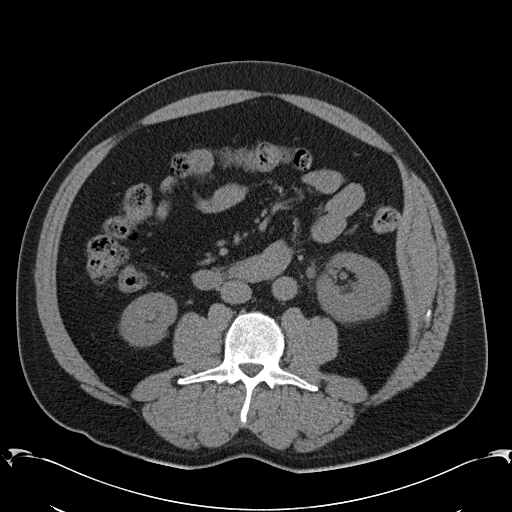
[im 54/92  bone]
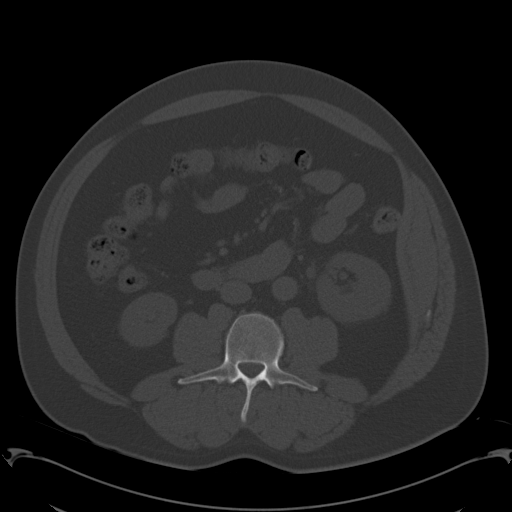
[im 61/92  soft-tissue]
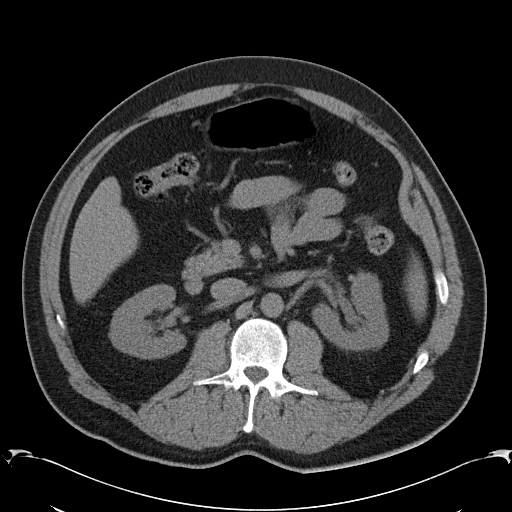
[im 69/92  soft-tissue]
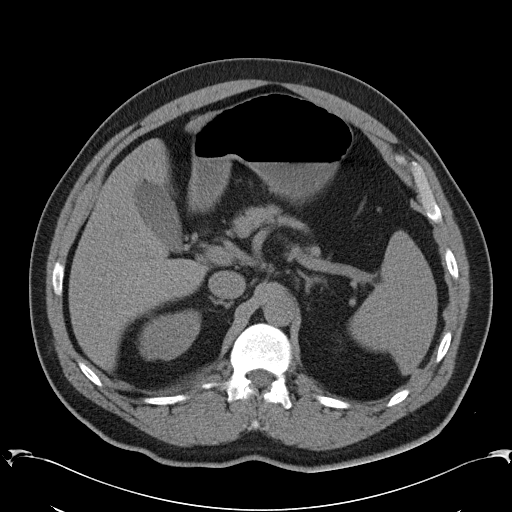
[im 73/92  soft-tissue]
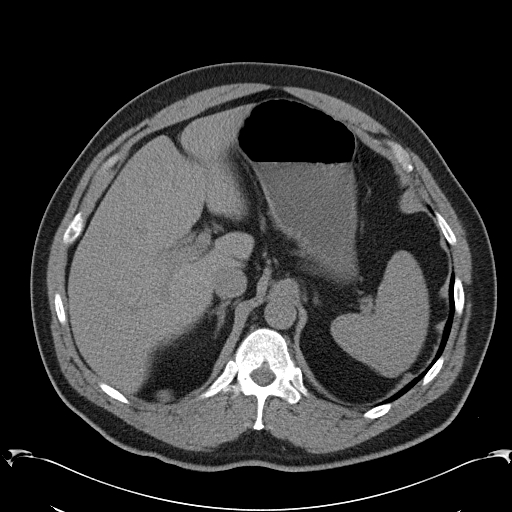
[im 80/92  soft-tissue]
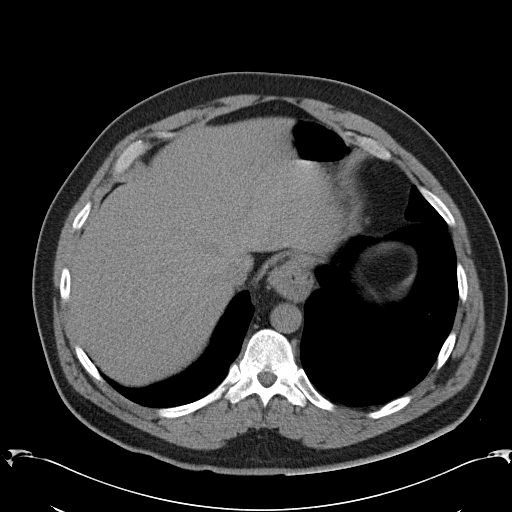
[im 88/92  soft-tissue]
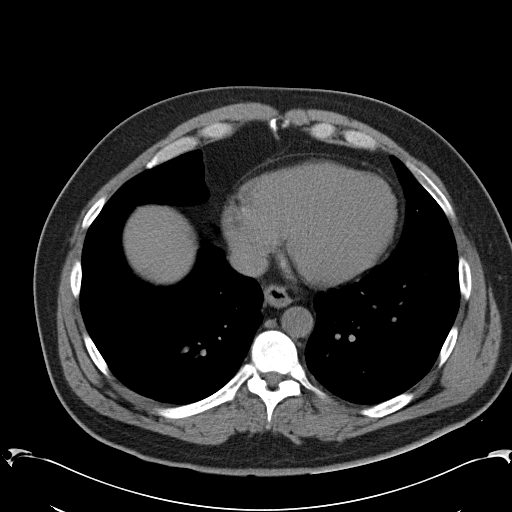

[Series 5: coronals cor · coronal · 0.89mm/px · 3 of 137 slices shown]
[im 46/137  soft-tissue]
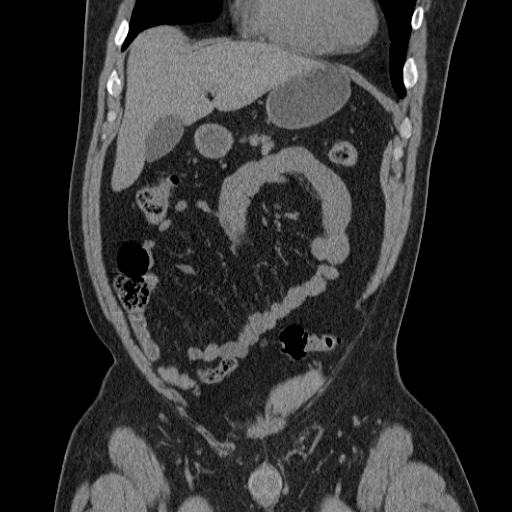
[im 61/137  soft-tissue]
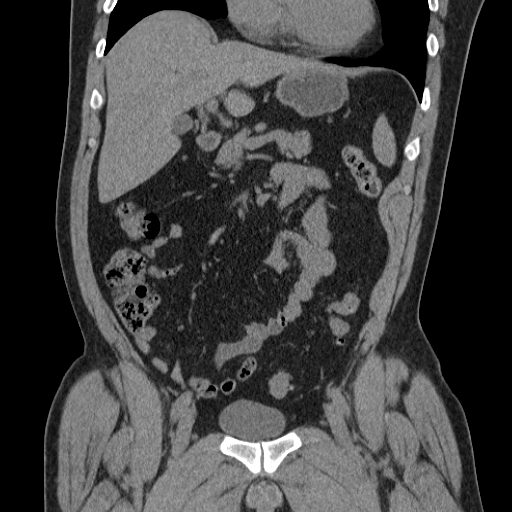
[im 76/137  soft-tissue]
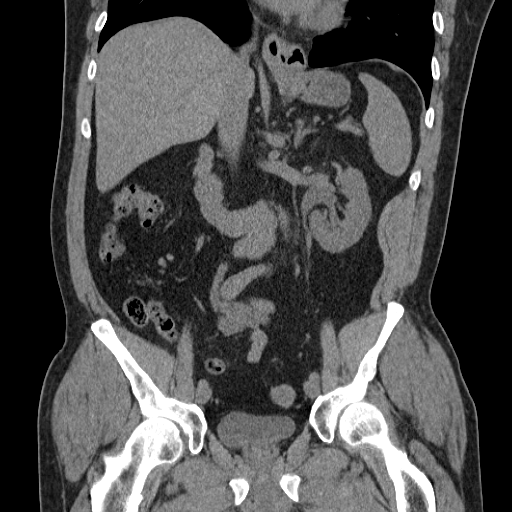

[17 of 46 positions shown; findings below may reference images not displayed]

FINDINGS: Sagittal images of the spine are unremarkable.

Lung bases are unremarkable.  Small hiatal hernia.

Unenhanced liver shows no biliary ductal dilatation.  No calcified
gallstones are noted within gallbladder.  The unenhanced pancreas,
spleen and adrenal glands are unremarkable.  Abdominal aorta is
unremarkable.

There is mild left hydronephrosis and proximal left hydroureter.
Axial image 42 there is 4 mm calcified obstructive calculus in the
proximal left ureter.  The calculus is at the level of the lower
endplate of the L3 vertebral body.

Mild left perinephric stranding.  Small amount of fluid/stranding
noted in front of the left psoas muscle.  The right ureter is
unremarkable.  No nephrolithiasis.

No small bowel obstruction.  No ascites or free air.  No
adenopathy.  The terminal ileum is unremarkable.  Normal appendix
is clearly visualized in axial image 57 bilateral distal ureter is
unremarkable.  No calcified calculi are noted within urinary
bladder.  Prostate gland measures 4.1 x 5.4 cm.  No inguinal
adenopathy.  No destructive bony lesions are noted within pelvis.
No distal colonic obstruction.  Probable small bilateral hydrocele.
A few diverticula are noted right colon without evidence of acute
diverticulitis.
IMPRESSION: 1.  There is mild left hydronephrosis and proximal left
hydroureter.
2.  4 mm calcified obstructive calculus in proximal left ureter at
the level of the lower endplate of the L3 vertebral body.  Mild
left perinephric stranding.
3.  Normal appendix.  No pericecal inflammation.

## 2014-01-25 IMAGING — CR DG ABDOMEN 1V
1 series · 1 of 1 positions shown · non-contrast
Comparison: CT 12/17/2012.

CLINICAL DATA: Pre lithotripsy.  Left stent placed to 12/17/2012.

ABDOMEN - 1 VIEW

[t abdomen supine]
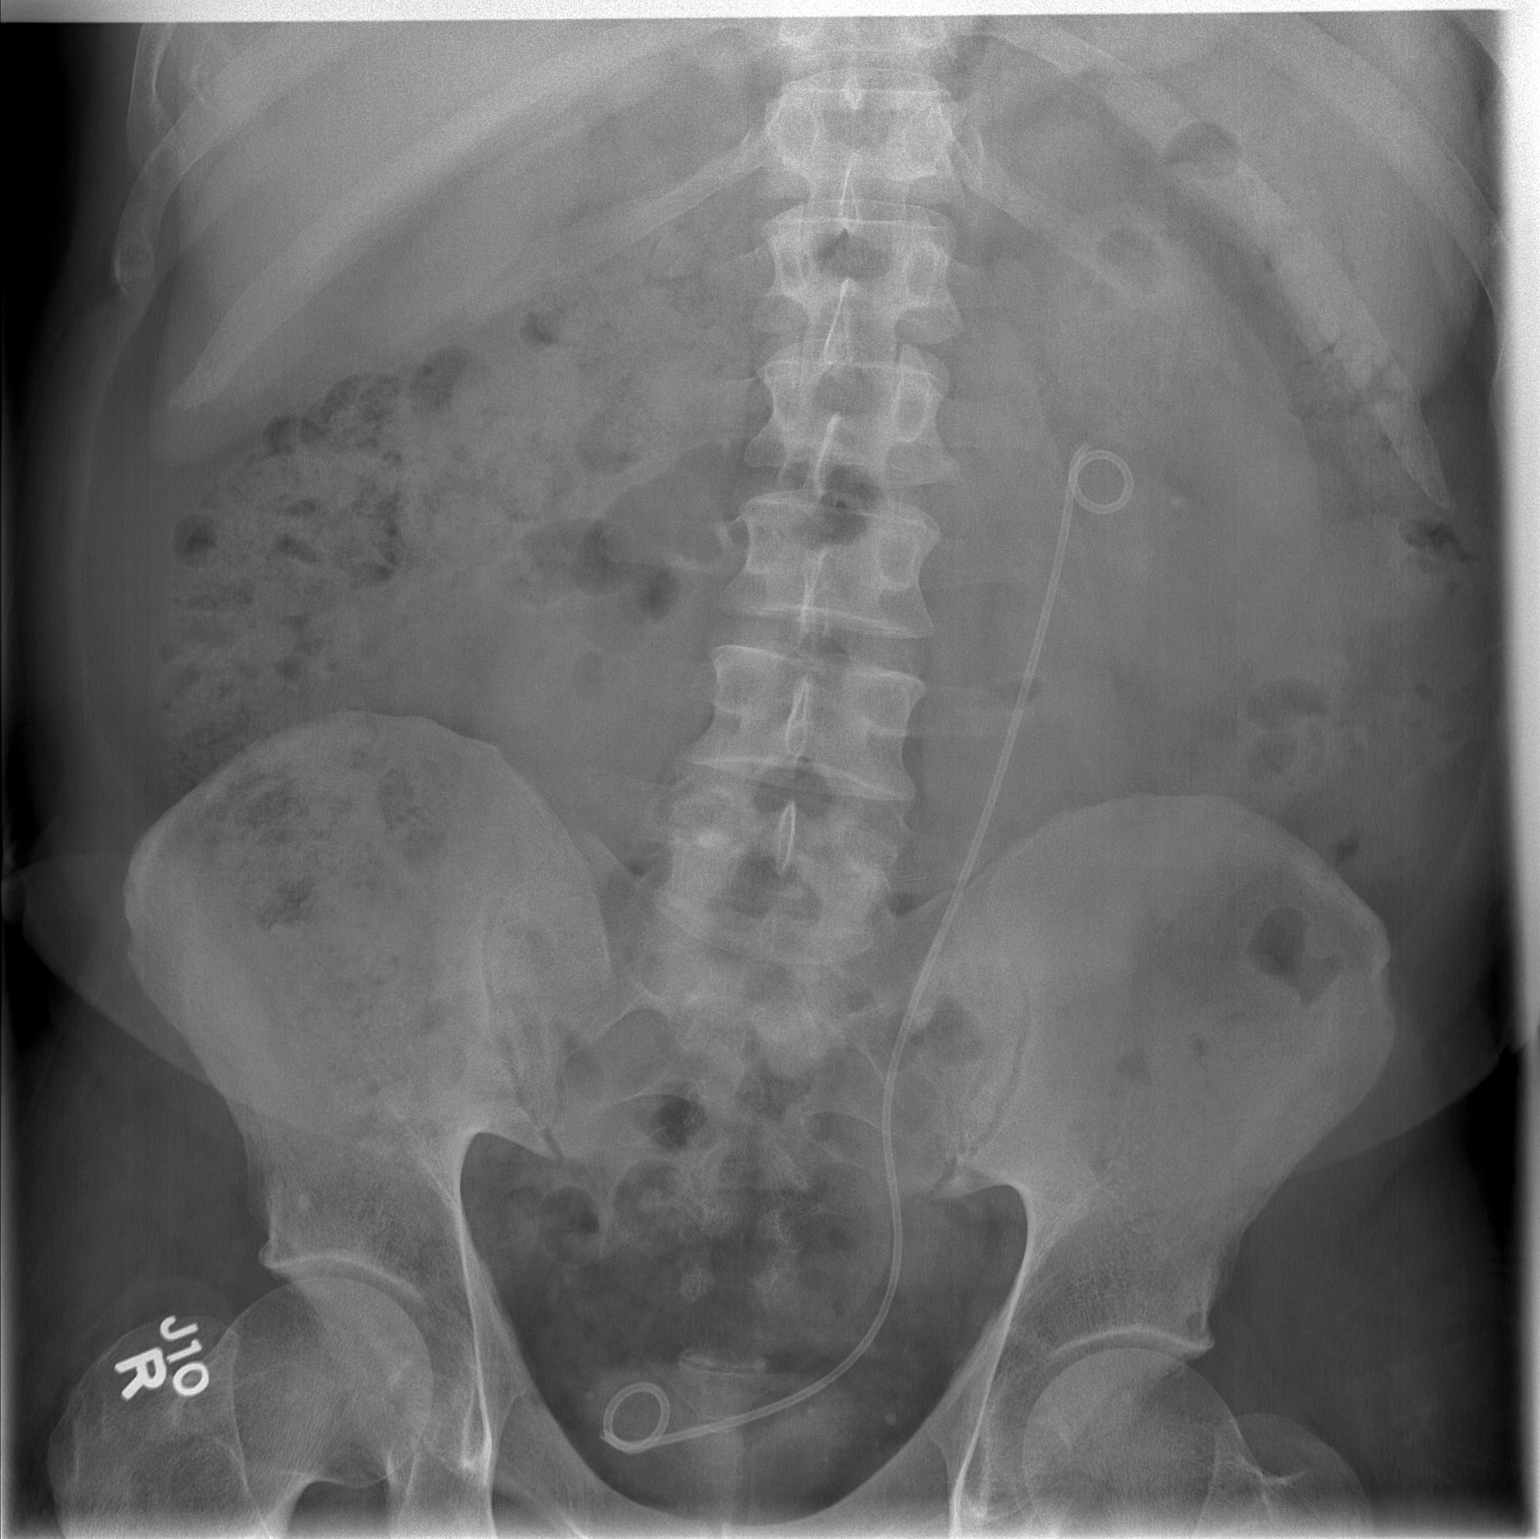

[1 of 1 positions shown; findings below may reference images not displayed]

FINDINGS: Left ureteral stent with proximal pigtail projected over
the left renal pelvis and distal pigtail projected over the
bladder.  4 mm calcification projected over the mid pole of the
left kidney lateral to the proximal stent pigtail. Suggestion of 4
mm calcification adjacent to the mid ureteral stent overlying the
sacrum.  No additional stones suggested.  Calcified phleboliths in
the pelvis.  Normal bowel gas pattern.  Visualized bones appear
intact.
IMPRESSION: 4 mm calcification projected over the mid portion of the left
kidney with possible additional calcification projected adjacent to
the mid left ureteral stent over the sacrum.

## 2016-06-17 ENCOUNTER — Ambulatory Visit: Payer: Self-pay

## 2018-08-21 ENCOUNTER — Encounter: Payer: Self-pay | Admitting: Physician Assistant

## 2018-08-21 ENCOUNTER — Ambulatory Visit (INDEPENDENT_AMBULATORY_CARE_PROVIDER_SITE_OTHER): Payer: Managed Care, Other (non HMO) | Admitting: Physician Assistant

## 2018-08-21 VITALS — BP 129/88 | HR 63 | Temp 98.3°F | Resp 17 | Wt 219.0 lb

## 2018-08-21 DIAGNOSIS — J029 Acute pharyngitis, unspecified: Secondary | ICD-10-CM | POA: Diagnosis not present

## 2018-08-21 DIAGNOSIS — R05 Cough: Secondary | ICD-10-CM

## 2018-08-21 DIAGNOSIS — R059 Cough, unspecified: Secondary | ICD-10-CM

## 2018-08-21 DIAGNOSIS — J069 Acute upper respiratory infection, unspecified: Secondary | ICD-10-CM | POA: Diagnosis not present

## 2018-08-21 LAB — POCT RAPID STREP A (OFFICE): Rapid Strep A Screen: NEGATIVE

## 2018-08-21 MED ORDER — HYDROCODONE-HOMATROPINE 5-1.5 MG/5ML PO SYRP: 5.0000 mL | ORAL_SOLUTION | Freq: Three times a day (TID) | ORAL | 0 refills | Status: DC | PRN

## 2018-08-21 MED ORDER — BENZONATATE 100 MG PO CAPS: 100.0000 mg | ORAL_CAPSULE | Freq: Three times a day (TID) | ORAL | 0 refills | Status: DC | PRN

## 2018-08-21 NOTE — Progress Notes (Signed)
   [name]  MRN: [id number] DOB: 1965/12/28  PCP: System, Pcp Not In  Subjective:  Pt is a 53 year old male who presents to clinic for sore throat and nasal congestion x 4 days. Endorses right ear pain occasionally. He has been taking Day Quil and NyQuil for symptom relief.  Denies fever, chills, diaphoresis, shob, wheezing.   Review of Systems  Constitutional: Negative for chills, diaphoresis, fatigue and fever.  HENT: Positive for congestion and sore throat. Negative for postnasal drip, rhinorrhea, sinus pressure, sinus pain and sneezing.   Respiratory: Negative for cough, shortness of breath and wheezing.     There are no active problems to display for this patient.   No current outpatient medications on file prior to visit.   No current facility-administered medications on file prior to visit.     No Known Allergies   Objective:  BP 129/88   Pulse 63   Temp 98.3 F (36.8 C) (Oral)   Resp 17   Wt 219 lb (99.3 kg)   SpO2 98%   BMI 32.34 kg/m   Physical Exam Vitals signs reviewed.  Constitutional:      General: He is not in acute distress. HENT:     Right Ear: Tympanic membrane normal.     Left Ear: Tympanic membrane normal.     Nose: Mucosal edema present. No rhinorrhea.     Right Sinus: No maxillary sinus tenderness or frontal sinus tenderness.     Left Sinus: No maxillary sinus tenderness or frontal sinus tenderness.  Cardiovascular:     Rate and Rhythm: Normal rate and regular rhythm.     Heart sounds: Normal heart sounds.  Pulmonary:     Effort: Pulmonary effort is normal. No respiratory distress.     Breath sounds: Normal breath sounds. No wheezing or rales.  Skin:    General: Skin is warm and dry.  Neurological:     Mental Status: He is alert and oriented to person, place, and time.  Psychiatric:        Judgment: Judgment normal.     Results for orders placed or performed in visit on 08/21/18  POCT rapid strep A  Result Value Ref Range   Rapid Strep A Screen Negative Negative   Assessment and Plan :  1. Acute upper respiratory infection - pt presents with sore throat and cough. Negative strep. Plan to treat supportively. RTC if no improvement in 5-7 days.  2. Sore throat - POCT rapid strep A - Culture, Group A Strep  3. Cough - benzonatate (TESSALON) 100 MG capsule; Take 1-2 capsules (100-200 mg total) by mouth 3 (three) times daily as needed for cough.  Dispense: 40 capsule; Refill: 0 - HYDROcodone-homatropine (HYCODAN) 5-1.5 MG/5ML syrup; Take 5 mLs by mouth every 8 (eight) hours as needed for cough.  Dispense: 120 mL; Refill: 0   Whitney Shalini Mair, PA-C  [facility] 08/21/2018 2:03 PM  Please note: Portions of this report may have been transcribed using dragon voice recognition software. Every effort was made to ensure accuracy; however, inadvertent computerized transcription errors may be present.

## 2018-08-21 NOTE — Patient Instructions (Addendum)
Tessalon is for cough during the day. This should not make you drowsy.  Hycodan is to help your cough at night. This will make you drowsy. Do not take this with any other medications that make your drowsy.  Buy the following at your pharmacy: Mucinex D will help thin out your mucus to help you clear it out. Use this for the next 3-4 days. Drink plenty of water while taking this medication.  Cepacol throat lozenges.  Consider using a Neti Pot (you can buy this at your pharmacy). If not, try the saline sinus rinse.   Come back if you are not better in 7-10 days.   Stay well hydrated. Get lost of rest. Wash your hands often.   -Foods that can help speed recovery: honey, garlic, chicken soup, elderberries, green tea.  -Supplements that can help speed recovery: vitamin C, zinc, elderberry extract, quercetin, ginseng, selenium  For sore throat: ? Gargle with 8 oz of salt water ( tsp of salt per 1 qt of water) as often as every 1-2 hours to soothe your throat.   For sore throat try using a honey-based tea. Use 3 teaspoons of honey with juice squeezed from half lemon. Place shaved pieces of ginger into 1/2-1 cup of water and warm over stove top. Then mix the ingredients and repeat every 4 hours as needed.  Cough Syrup Recipe: Sweet Lemon & Honey Thyme  Ingredients . a handful of fresh thyme sprigs   . 1 pint of water (2 cups)  . 1/2 cup honey (raw is best, but regular will do)  . 1/2 lemon chopped Instructions 1. Place the lemon in the pint jar and cover with the honey. The honey will macerate the lemons and draw out liquids which taste so delicious! 2. Meanwhile, toss the thyme leaves into a saucepan and cover them with the water. 3. Bring the water to a gentle simmer and reduce it to half, about a cup of tea. 4. When the tea is reduced and cooled a bit, strain the sprigs & leaves, add it into the pint jar and stir it well. 5. Give it a shake and use a spoonful as needed. 6. Store your  homemade cough syrup in the refrigerator for about a month.  --------------------------------------------------------------  ACUTE VIRAL RHINOSINUSITIS - Patients with acute viral rhinosinusitis (AVRS) should be managed with supportive care. There are no treatments to shorten the clinical course of the disease.  Natural history - AVRS may not completely resolve within 10 days but is expected to improve. Patients who fail to improve after ?10 days of symptomatic management are more likely to have acute bacterial rhinosinusitis. Symptomatic therapies - Symptomatic management of acute rhinosinusitis (ARS) aims to relieve symptoms of nasal obstruction and runny nose as well as the systemic signs and symptoms such as fever and fatigue. When needed, we suggest over-the-counter (OTC) analgesics and antipyretics, saline irrigation, and intranasal glucocorticoids for symptomatic management in patients with ARS. Analgesics and antipyretics - OTC analgesics and antipyretics such as nonsteroidal anti-inflammatory drugs and acetaminophen can be used for pain and fever relief as needed. Saline irrigation - Mechanical irrigation with saline may reduce the need for pain medication and improve overall patient comfort, particularly in patients with frequent sinus infections. It is important that irrigants be prepared from sterile or bottled water. (See below for instructions)  Intranasal glucocorticoids -  Intranasal glucocorticoids are likely to be most beneficial for patients with underlying allergies. This allows improved sinus drainage. A higher dose  of intranasal glucocorticoids had a stronger effect on symptom improvement. -Other- ?Oral decongestants - Oral decongestants may be useful when eustachian tube dysfunction is a factor for patients with AVRS. These patients may benefit from a short course (three to five days) of oral decongestants. Oral decongestants should be used with caution in patients with  cardiovascular disease, hypertension, angle-closure glaucoma, or bladder neck obstruction. ?Intranasal decongestants - Intranasal decongestants are often used as symptomatic therapies by patients. These agents, such as oxymetazoline, may provide a subjective sense of improved nasal patency. There is also concern that intranasal decongestants themselves may provoke mucosal inflammation. If used, topical decongestants should be used sparingly for no more than three consecutive days to avoid rebound congestion, addiction, and mucosal damage associated with long-term use. ?Antihistamines - Antihistamines are frequently used for symptom relief due to their drying effects; however, there are no studies investigating their efficacy for ARS. Over-drying of the mucosa may lead to further discomfort. Additionally, antihistamines are often associated with adverse effects.  ?Mucolytics - Mucolytics such as guaifenesin serve to thin secretions and may promote ease of mucus drainage and clearance.   SALINE NASAL IRRIGATION  The benefits  1. Saline (saltwater) washes the mucus and irritants from your nose.  2. The sinus passages are moisturized.  3. Studies have also shown that a nasal irrigation improves cell function (the cells that move the mucus work better).  The recipe  Use a one-quart glass jar that is thoroughly cleansed.  You may use a large medical syringe (30 cc), water pick with an irrigation tip (preferred method), squeeze bottle, or Neti pot. Do not use a baby bulb syringe. The syringe or pick should be sterilized frequently or replaced every two to three weeks to avoid contamination and infection.  Fill with water that has been distilled, previously boiled, or otherwise sterilized. Plain tap water is not recommended, because it is not necessarily sterile.  Add 1 to 1 heaping teaspoons of pickling/canning salt. Do not use table salt, because it contains a large number of additives.  Add 1 teaspoon of  baking soda (pure bicarbonate).  Mix ingredients together, and store at room temperature. Discard after one week.  You may also make up a solution from premixed packets that are commercially prepared specifically for nasal irrigation.  The instructions  Irrigate your nose with saline one to two times per day.   If you have been told to use nasal medication, you should always use your saline solution first. The nasal medication is much more effective when sprayed onto clean nasal membranes, and the spray will reach deeper into the nose.   Pour the amount of fluid you plan to use into a clean bowl. Do not put your used syringe back into the storage container, because it contaminates your solution.   You may warm the solution slightly in the microwave, but be sure that the solution is not hot.   Bend over the sink (some people do this in the shower), and squirt the solution into each side of your nose, aiming the stream toward the back of your head, not the top of your head. The solution should flow into one nostril and out of the other, but it will not harm you if you swallow a little.   Some people experience a little burning sensation the first few times that they use buffered saline solution, but this usually goes away after they adapt to it.

## 2018-08-23 LAB — CULTURE, GROUP A STREP: Strep A Culture: NEGATIVE

## 2019-07-07 ENCOUNTER — Other Ambulatory Visit: Payer: Self-pay

## 2019-07-07 ENCOUNTER — Ambulatory Visit (INDEPENDENT_AMBULATORY_CARE_PROVIDER_SITE_OTHER): Payer: Managed Care, Other (non HMO) | Admitting: Registered Nurse

## 2019-07-07 ENCOUNTER — Encounter: Payer: Self-pay | Admitting: Registered Nurse

## 2019-07-07 VITALS — BP 121/82 | HR 90 | Temp 97.8°F | Ht 69.0 in | Wt 217.8 lb

## 2019-07-07 DIAGNOSIS — Z13220 Encounter for screening for lipoid disorders: Secondary | ICD-10-CM | POA: Diagnosis not present

## 2019-07-07 DIAGNOSIS — R590 Localized enlarged lymph nodes: Secondary | ICD-10-CM | POA: Diagnosis not present

## 2019-07-07 DIAGNOSIS — Z1329 Encounter for screening for other suspected endocrine disorder: Secondary | ICD-10-CM

## 2019-07-07 DIAGNOSIS — Z8639 Personal history of other endocrine, nutritional and metabolic disease: Secondary | ICD-10-CM | POA: Diagnosis not present

## 2019-07-07 DIAGNOSIS — Z13228 Encounter for screening for other metabolic disorders: Secondary | ICD-10-CM

## 2019-07-07 DIAGNOSIS — Z130 Encounter for screening for diseases of the blood and blood-forming organs and certain disorders involving the immune mechanism: Secondary | ICD-10-CM

## 2019-07-07 MED ORDER — AMOXICILLIN-POT CLAVULANATE 875-125 MG PO TABS: 1.0000 | ORAL_TABLET | Freq: Two times a day (BID) | ORAL | 0 refills | Status: AC

## 2019-07-07 NOTE — Patient Instructions (Signed)
° ° ° °  If you have lab work done today you will be contacted with your lab results within the next 2 weeks.  If you have not heard from us then please contact us. The fastest [place] to get your results is to register for My Chart. ° ° °IF you received an x-ray today, you will receive an invoice from [facility]. Please contact [facility] at [phone] with questions or concerns regarding your invoice.  ° °IF you received labwork today, you will receive an invoice from [facility]. Please contact [facility] at [phone] with questions or concerns regarding your invoice.  ° °Our billing staff will not be able to assist you with questions regarding bills from these companies. ° °You will be contacted with the lab results as soon as they are available. The fastest [place] to get your results is to activate your My Chart account. Instructions are located on the last page of this paperwork. If you have not heard from us regarding the results in 2 weeks, please contact this office. °  ° ° ° °

## 2019-07-08 ENCOUNTER — Other Ambulatory Visit: Payer: Self-pay | Admitting: Registered Nurse

## 2019-07-08 ENCOUNTER — Encounter: Payer: Self-pay | Admitting: Registered Nurse

## 2019-07-08 ENCOUNTER — Telehealth: Payer: Self-pay | Admitting: Registered Nurse

## 2019-07-08 DIAGNOSIS — R590 Localized enlarged lymph nodes: Secondary | ICD-10-CM | POA: Insufficient documentation

## 2019-07-08 DIAGNOSIS — Z8639 Personal history of other endocrine, nutritional and metabolic disease: Secondary | ICD-10-CM | POA: Insufficient documentation

## 2019-07-08 DIAGNOSIS — E785 Hyperlipidemia, unspecified: Secondary | ICD-10-CM

## 2019-07-08 LAB — CBC WITH DIFFERENTIAL/PLATELET
Basophils Absolute: 0.1 10*3/uL (ref 0.0–0.2)
Basos: 1 %
EOS (ABSOLUTE): 0.1 10*3/uL (ref 0.0–0.4)
Eos: 2 %
Hematocrit: 44.4 % (ref 37.5–51.0)
Hemoglobin: 15.3 g/dL (ref 13.0–17.7)
Immature Grans (Abs): 0 10*3/uL (ref 0.0–0.1)
Immature Granulocytes: 1 %
Lymphocytes Absolute: 2.3 10*3/uL (ref 0.7–3.1)
Lymphs: 29 %
MCH: 28.8 pg (ref 26.6–33.0)
MCHC: 34.5 g/dL (ref 31.5–35.7)
MCV: 84 fL (ref 79–97)
Monocytes Absolute: 0.6 10*3/uL (ref 0.1–0.9)
Monocytes: 7 %
Neutrophils Absolute: 4.7 10*3/uL (ref 1.4–7.0)
Neutrophils: 60 %
Platelets: 263 10*3/uL (ref 150–450)
RBC: 5.31 x10E6/uL (ref 4.14–5.80)
RDW: 13 % (ref 11.6–15.4)
WBC: 7.8 10*3/uL (ref 3.4–10.8)

## 2019-07-08 LAB — COMPREHENSIVE METABOLIC PANEL
ALT: 21 IU/L (ref 0–44)
AST: 22 IU/L (ref 0–40)
Albumin/Globulin Ratio: 2 (ref 1.2–2.2)
Albumin: 5.1 g/dL — ABNORMAL HIGH (ref 3.8–4.9)
Alkaline Phosphatase: 84 IU/L (ref 39–117)
BUN/Creatinine Ratio: 13 (ref 9–20)
BUN: 15 mg/dL (ref 6–24)
Bilirubin Total: 1.2 mg/dL (ref 0.0–1.2)
CO2: 24 mmol/L (ref 20–29)
Calcium: 9.9 mg/dL (ref 8.7–10.2)
Chloride: 99 mmol/L (ref 96–106)
Creatinine, Ser: 1.14 mg/dL (ref 0.76–1.27)
GFR calc Af Amer: 86 mL/min/{1.73_m2} (ref >59)
GFR calc non Af Amer: 74 mL/min/{1.73_m2} (ref >59)
Globulin, Total: 2.6 g/dL (ref 1.5–4.5)
Glucose: 92 mg/dL (ref 65–99)
Potassium: 4.6 mmol/L (ref 3.5–5.2)
Sodium: 139 mmol/L (ref 134–144)
Total Protein: 7.7 g/dL (ref 6.0–8.5)

## 2019-07-08 LAB — LIPID PANEL
Chol/HDL Ratio: 5.2 ratio — ABNORMAL HIGH (ref 0.0–5.0)
Cholesterol, Total: 207 mg/dL — ABNORMAL HIGH (ref 100–199)
HDL: 40 mg/dL (ref >39)
LDL Chol Calc (NIH): 120 mg/dL — ABNORMAL HIGH (ref 0–99)
Triglycerides: 265 mg/dL — ABNORMAL HIGH (ref 0–149)
VLDL Cholesterol Cal: 47 mg/dL — ABNORMAL HIGH (ref 5–40)

## 2019-07-08 LAB — HEMOGLOBIN A1C
Est. average glucose Bld gHb Est-mCnc: 100 mg/dL
Hgb A1c MFr Bld: 5.1 % (ref 4.8–5.6)

## 2019-07-08 LAB — TSH: TSH: 3.01 u[IU]/mL (ref 0.450–4.500)

## 2019-07-08 MED ORDER — ATORVASTATIN CALCIUM 20 MG PO TABS: 20.0000 mg | ORAL_TABLET | Freq: Every day | ORAL | 3 refills | Status: AC

## 2019-07-08 NOTE — Progress Notes (Signed)
Good morning,  If someone could give [name] a call to let him know his labs are back, that'd be great. No elevated WBCs indicating infection.  His lipids are mildly elevated - I would like him to start atorvastatin 20mg  each evening with dinner. If he has any negative side effects, he should stop the medication and let us know. I'd like to see him in about 6 months for labs if possible.  Thank you,  Kathrin Ruddy, NP

## 2019-07-08 NOTE — Progress Notes (Signed)
Acute Office Visit  Subjective:    Patient ID: [name], male    DOB: 06/27/1966, 53 y.o.   MRN: [id number]  Chief Complaint  Patient presents with  . lump under right jaw    found it sat night     HPI Patient is in today for mass on neck  Located under ear, behind jaw on R side. Near tonsillar lymph nodes. Noticed it on Saturday. TTP. Pain when moving neck too far or too rapidly. No recent URI or dental infection to his knowledge. Recently had a crown put in on that side, states it has been tender a little longer than other crowns he's had in the past. No history of malignancy This has not happened before It has been steady since onset No dysphagia, globus sensation, hemoptysis.  Past Medical History:  Diagnosis Date  . GERD (gastroesophageal reflux disease)   . Headache(784.0)   . Kidney stones   . Rash     Past Surgical History:  Procedure Laterality Date  . CYSTOSCOPY WITH RETROGRADE PYELOGRAM, URETEROSCOPY AND STENT PLACEMENT Left 12/17/2012   Procedure: CYSTOSCOPY WITH RETROGRADE PYELOGRAM, URETEROSCOPY AND STENT PLACEMENT;  Surgeon: Kathi Ludwig, MD;  Location: [facility];  Service: Urology;  Laterality: Left;  . LUMBAR LAMINECTOMY/DECOMPRESSION MICRODISCECTOMY  01/17/2012   Procedure: LUMBAR LAMINECTOMY/DECOMPRESSION MICRODISCECTOMY;  Surgeon: Emilee Hero, MD;  Location: [facility] OR;  Service: Orthopedics;  Laterality: Right;  Right sided lumbar 5-sacrum 1 microdisectomy    No family history on file.  Social History   Socioeconomic History  . Marital status: Married    Spouse name: Not on file  . Number of children: Not on file  . Years of education: Not on file  . Highest education level: Not on file  Occupational History  . Not on file  Tobacco Use  . Smoking status: Never Smoker  . Smokeless tobacco: Never Used  Substance and Sexual Activity  . Alcohol use: Yes    Comment: weekly  . Drug use: No  . Sexual activity: Not on file  Other  Topics Concern  . Not on file  Social History Narrative  . Not on file   Social Determinants of Health   Financial Resource Strain:   . Difficulty of Paying Living Expenses: Not on file  Food Insecurity:   . Worried About Programme researcher, broadcasting/film/video in the Last Year: Not on file  . Ran Out of Food in the Last Year: Not on file  Transportation Needs:   . Lack of Transportation (Medical): Not on file  . Lack of Transportation (Non-Medical): Not on file  Physical Activity:   . Days of Exercise per Week: Not on file  . Minutes of Exercise per Session: Not on file  Stress:   . Feeling of Stress : Not on file  Social Connections:   . Frequency of Communication with Friends and Family: Not on file  . Frequency of Social Gatherings with Friends and Family: Not on file  . Attends Religious Services: Not on file  . Active Member of Clubs or Organizations: Not on file  . Attends Banker Meetings: Not on file  . Marital Status: Not on file  Intimate Partner Violence:   . Fear of Current or Ex-Partner: Not on file  . Emotionally Abused: Not on file  . Physically Abused: Not on file  . Sexually Abused: Not on file    Outpatient Medications Prior to Visit  Medication Sig Dispense Refill  .  benzonatate (TESSALON) 100 MG capsule Take 1-2 capsules (100-200 mg total) by mouth 3 (three) times daily as needed for cough. 40 capsule 0  . HYDROcodone-homatropine (HYCODAN) 5-1.5 MG/5ML syrup Take 5 mLs by mouth every 8 (eight) hours as needed for cough. 120 mL 0  . traMADol (ULTRAM) 50 MG tablet Take 50 mg by mouth 3 (three) times daily as needed.     No facility-administered medications prior to visit.    No Known Allergies  Review of Systems  Constitutional: Negative.   HENT: Negative.   Eyes: Negative.   Respiratory: Negative.   Cardiovascular: Negative.   Gastrointestinal: Negative.   Endocrine: Negative.   Genitourinary: Negative.   Musculoskeletal: Negative.   Skin: Negative.    Allergic/Immunologic: Negative.   Neurological: Negative.   Hematological: Positive for adenopathy. Does not bruise/bleed easily.  Psychiatric/Behavioral: Negative.   All other systems reviewed and are negative.      Objective:    Physical Exam Vitals and nursing note reviewed.  Constitutional:      General: He is not in acute distress.    Appearance: Normal appearance. He is not ill-appearing, toxic-appearing or diaphoretic.  HENT:     Head: Normocephalic and atraumatic.     Right Ear: Tympanic membrane, ear canal and external ear normal. There is no impacted cerumen.     Left Ear: Tympanic membrane, ear canal and external ear normal. There is no impacted cerumen.     Nose: Nose normal. No congestion or rhinorrhea.     Mouth/Throat:     Mouth: Mucous membranes are moist.     Pharynx: Oropharynx is clear. No oropharyngeal exudate or posterior oropharyngeal erythema.  Cardiovascular:     Rate and Rhythm: Normal rate and regular rhythm.  Pulmonary:     Effort: Pulmonary effort is normal. No respiratory distress.  Musculoskeletal:        General: Normal range of motion.     Cervical back: Normal range of motion. Tenderness (R tonsillar lymph node) present.  Lymphadenopathy:     Cervical: Cervical adenopathy (R tonsillar. TTP. Firm, mobile. Visibly swollen.) present.  Skin:    Capillary Refill: Capillary refill takes less than 2 seconds.  Neurological:     General: No focal deficit present.     Mental Status: He is alert and oriented to person, place, and time. Mental status is at baseline.  Psychiatric:        Mood and Affect: Mood normal.        Behavior: Behavior normal.        Thought Content: Thought content normal.        Judgment: Judgment normal.     BP 121/82   Pulse 90   Temp 97.8 F (36.6 C) (Temporal)   Ht 5\' 9"  (1.753 m)   Wt 217 lb 12.8 oz (98.8 kg)   SpO2 94%   BMI 32.16 kg/m  Wt Readings from Last 3 Encounters:  07/07/19 217 lb 12.8 oz (98.8 kg)    08/21/18 219 lb (99.3 kg)  10/08/13 214 lb 6.4 oz (97.3 kg)    Health Maintenance Due  Topic Date Due  . HIV Screening  03/07/1983  . TETANUS/TDAP  03/07/1987  . COLONOSCOPY  03/06/2018  . INFLUENZA VACCINE  02/15/2019    There are no preventive care reminders to display for this patient.   Lab Results  Component Value Date   TSH 3.010 07/07/2019   Lab Results  Component Value Date   WBC 7.8 07/07/2019  HGB 15.3 07/07/2019   HCT 44.4 07/07/2019   MCV 84 07/07/2019   PLT 263 07/07/2019   Lab Results  Component Value Date   NA 139 07/07/2019   K 4.6 07/07/2019   CO2 24 07/07/2019   GLUCOSE 92 07/07/2019   BUN 15 07/07/2019   CREATININE 1.14 07/07/2019   BILITOT 1.2 07/07/2019   ALKPHOS 84 07/07/2019   AST 22 07/07/2019   ALT 21 07/07/2019   PROT 7.7 07/07/2019   ALBUMIN 5.1 (H) 07/07/2019   CALCIUM 9.9 07/07/2019   Lab Results  Component Value Date   CHOL 207 (H) 07/07/2019   Lab Results  Component Value Date   HDL 40 07/07/2019   Lab Results  Component Value Date   LDLCALC 120 (H) 07/07/2019   Lab Results  Component Value Date   TRIG 265 (H) 07/07/2019   Lab Results  Component Value Date   CHOLHDL 5.2 (H) 07/07/2019   Lab Results  Component Value Date   HGBA1C 5.1 07/07/2019       Assessment & Plan:   Problem List Items Addressed This Visit    None    Visit Diagnoses    Cervical lymphadenopathy    -  Primary   Relevant Medications   amoxicillin-clavulanate (AUGMENTIN) 875-125 MG tablet   Other Relevant Orders   CBC with Differential (Completed)   History of hypothyroidism       Relevant Orders   TSH (Completed)   Screening for endocrine, metabolic and immunity disorder       Relevant Orders   CBC with Differential (Completed)   Hemoglobin A1c (Completed)   Comprehensive metabolic panel (Completed)   TSH (Completed)   Lipid screening       Relevant Orders   Lipid Panel (Completed)       Meds ordered this encounter   Medications  . amoxicillin-clavulanate (AUGMENTIN) 875-125 MG tablet    Sig: Take 1 tablet by mouth 2 (two) times daily.    Dispense:  14 tablet    Refill:  0    Order Specific Question:   Supervising Provider    Answer:   Doristine BosworthSTALLINGS, ZOE A [id number]]   PLAN  Likely a dental infection or related - will give Augmentin po bid for 7 days. If no improvement, will refer to ENT  Reviewed this plan with patient who is in agreement  Patient encouraged to call clinic with any questions, comments, or concerns.   Janeece Ageeichard Rowdy Guerrini, NP

## 2019-07-08 NOTE — Telephone Encounter (Signed)
Copied from [id number] [phone]. Topic: General - Other >> Jul 08, 2019  3:13 PM Yvette Rack wrote: Reason for CRM: Pt called for an update on his lab results. Pt requests call back

## 2019-07-09 NOTE — Telephone Encounter (Signed)
Left message

## 2019-07-10 ENCOUNTER — Encounter: Payer: Self-pay | Admitting: *Deleted
